# Patient Record
Sex: Female | Born: 1970 | Race: Black or African American | Hispanic: No | Marital: Married | Smoking: Never smoker
Health system: Southern US, Community
[De-identification: ages and names within clinical notes are randomized; demographics above are authoritative.]

## PROBLEM LIST (undated history)

## (undated) DIAGNOSIS — O24419 Gestational diabetes mellitus in pregnancy, unspecified control: Secondary | ICD-10-CM

## (undated) HISTORY — DX: Gestational diabetes mellitus in pregnancy, unspecified control: O24.419

---

## 1998-02-26 ENCOUNTER — Encounter: Admission: RE | Admit: 1998-02-26 | Discharge: 1998-02-26 | Payer: Self-pay | Admitting: *Deleted

## 1998-05-12 ENCOUNTER — Encounter: Admission: RE | Admit: 1998-05-12 | Discharge: 1998-05-12 | Payer: Self-pay | Admitting: Family Medicine

## 1998-06-08 ENCOUNTER — Encounter: Admission: RE | Admit: 1998-06-08 | Discharge: 1998-06-08 | Payer: Self-pay | Admitting: Family Medicine

## 1998-07-06 ENCOUNTER — Ambulatory Visit (HOSPITAL_COMMUNITY): Admission: RE | Admit: 1998-07-06 | Discharge: 1998-07-06 | Payer: Self-pay | Admitting: *Deleted

## 1998-10-22 ENCOUNTER — Encounter: Admission: RE | Admit: 1998-10-22 | Discharge: 1998-10-22 | Payer: Self-pay | Admitting: Family Medicine

## 1998-12-03 ENCOUNTER — Encounter: Admission: RE | Admit: 1998-12-03 | Discharge: 1998-12-03 | Payer: Self-pay | Admitting: Family Medicine

## 1998-12-28 ENCOUNTER — Encounter: Admission: RE | Admit: 1998-12-28 | Discharge: 1998-12-28 | Payer: Self-pay | Admitting: Family Medicine

## 1999-01-27 ENCOUNTER — Encounter: Admission: RE | Admit: 1999-01-27 | Discharge: 1999-01-27 | Payer: Self-pay | Admitting: Family Medicine

## 1999-02-25 ENCOUNTER — Ambulatory Visit (HOSPITAL_COMMUNITY): Admission: RE | Admit: 1999-02-25 | Discharge: 1999-02-25 | Payer: Self-pay | Admitting: *Deleted

## 1999-03-10 ENCOUNTER — Encounter: Admission: RE | Admit: 1999-03-10 | Discharge: 1999-03-10 | Payer: Self-pay | Admitting: Family Medicine

## 1999-03-15 ENCOUNTER — Inpatient Hospital Stay (HOSPITAL_COMMUNITY): Admission: AD | Admit: 1999-03-15 | Discharge: 1999-03-15 | Payer: Self-pay | Admitting: *Deleted

## 1999-03-28 ENCOUNTER — Encounter: Admission: RE | Admit: 1999-03-28 | Discharge: 1999-03-28 | Payer: Self-pay | Admitting: Family Medicine

## 1999-04-14 ENCOUNTER — Encounter: Admission: RE | Admit: 1999-04-14 | Discharge: 1999-04-14 | Payer: Self-pay | Admitting: Family Medicine

## 1999-04-18 ENCOUNTER — Encounter: Payer: Self-pay | Admitting: *Deleted

## 1999-04-18 ENCOUNTER — Ambulatory Visit (HOSPITAL_COMMUNITY): Admission: RE | Admit: 1999-04-18 | Discharge: 1999-04-18 | Payer: Self-pay | Admitting: *Deleted

## 1999-04-25 ENCOUNTER — Encounter: Admission: RE | Admit: 1999-04-25 | Discharge: 1999-04-25 | Payer: Self-pay | Admitting: Family Medicine

## 1999-04-29 ENCOUNTER — Encounter: Admission: RE | Admit: 1999-04-29 | Discharge: 1999-04-29 | Payer: Self-pay | Admitting: Family Medicine

## 1999-05-09 ENCOUNTER — Encounter: Admission: RE | Admit: 1999-05-09 | Discharge: 1999-05-09 | Payer: Self-pay | Admitting: Family Medicine

## 1999-05-13 ENCOUNTER — Encounter: Admission: RE | Admit: 1999-05-13 | Discharge: 1999-08-11 | Payer: Self-pay | Admitting: *Deleted

## 1999-05-19 ENCOUNTER — Encounter: Payer: Self-pay | Admitting: *Deleted

## 1999-05-19 ENCOUNTER — Ambulatory Visit (HOSPITAL_COMMUNITY): Admission: RE | Admit: 1999-05-19 | Discharge: 1999-05-19 | Payer: Self-pay | Admitting: *Deleted

## 1999-05-23 ENCOUNTER — Encounter: Admission: RE | Admit: 1999-05-23 | Discharge: 1999-05-23 | Payer: Self-pay | Admitting: Family Medicine

## 1999-05-30 ENCOUNTER — Encounter: Admission: RE | Admit: 1999-05-30 | Discharge: 1999-05-30 | Payer: Self-pay | Admitting: Family Medicine

## 1999-05-31 ENCOUNTER — Encounter (HOSPITAL_COMMUNITY): Admission: RE | Admit: 1999-05-31 | Discharge: 1999-06-24 | Payer: Self-pay | Admitting: *Deleted

## 1999-06-08 ENCOUNTER — Encounter: Admission: RE | Admit: 1999-06-08 | Discharge: 1999-06-08 | Payer: Self-pay | Admitting: Family Medicine

## 1999-06-16 ENCOUNTER — Encounter: Payer: Self-pay | Admitting: *Deleted

## 1999-06-17 ENCOUNTER — Encounter: Admission: RE | Admit: 1999-06-17 | Discharge: 1999-06-17 | Payer: Self-pay | Admitting: Family Medicine

## 1999-06-23 ENCOUNTER — Inpatient Hospital Stay (HOSPITAL_COMMUNITY): Admission: AD | Admit: 1999-06-23 | Discharge: 1999-06-26 | Payer: Self-pay | Admitting: Obstetrics & Gynecology

## 1999-06-23 ENCOUNTER — Encounter: Payer: Self-pay | Admitting: Obstetrics and Gynecology

## 1999-06-23 ENCOUNTER — Encounter (INDEPENDENT_AMBULATORY_CARE_PROVIDER_SITE_OTHER): Payer: Self-pay | Admitting: Specialist

## 1999-06-27 ENCOUNTER — Encounter: Admission: RE | Admit: 1999-06-27 | Discharge: 1999-09-20 | Payer: Self-pay | Admitting: Obstetrics and Gynecology

## 1999-06-30 ENCOUNTER — Encounter: Admission: RE | Admit: 1999-06-30 | Discharge: 1999-06-30 | Payer: Self-pay | Admitting: Family Medicine

## 1999-08-03 ENCOUNTER — Encounter: Admission: RE | Admit: 1999-08-03 | Discharge: 1999-08-03 | Payer: Self-pay | Admitting: Family Medicine

## 1999-09-22 ENCOUNTER — Encounter: Admission: RE | Admit: 1999-09-22 | Discharge: 1999-09-22 | Payer: Self-pay | Admitting: Family Medicine

## 1999-10-03 ENCOUNTER — Encounter: Admission: RE | Admit: 1999-10-03 | Discharge: 1999-10-03 | Payer: Self-pay | Admitting: Family Medicine

## 2000-08-27 ENCOUNTER — Encounter: Admission: RE | Admit: 2000-08-27 | Discharge: 2000-08-27 | Payer: Self-pay | Admitting: Family Medicine

## 2001-04-30 ENCOUNTER — Encounter: Admission: RE | Admit: 2001-04-30 | Discharge: 2001-04-30 | Payer: Self-pay | Admitting: Sports Medicine

## 2001-11-26 ENCOUNTER — Encounter: Admission: RE | Admit: 2001-11-26 | Discharge: 2001-11-26 | Payer: Self-pay | Admitting: Sports Medicine

## 2001-12-19 ENCOUNTER — Encounter: Admission: RE | Admit: 2001-12-19 | Discharge: 2001-12-19 | Payer: Self-pay | Admitting: Obstetrics and Gynecology

## 2002-01-13 ENCOUNTER — Ambulatory Visit (HOSPITAL_COMMUNITY): Admission: RE | Admit: 2002-01-13 | Discharge: 2002-01-13 | Payer: Self-pay | Admitting: Obstetrics and Gynecology

## 2002-01-31 ENCOUNTER — Inpatient Hospital Stay (HOSPITAL_COMMUNITY): Admission: AD | Admit: 2002-01-31 | Discharge: 2002-01-31 | Payer: Self-pay | Admitting: Obstetrics and Gynecology

## 2002-09-10 ENCOUNTER — Encounter: Admission: RE | Admit: 2002-09-10 | Discharge: 2002-09-10 | Payer: Self-pay | Admitting: Family Medicine

## 2002-09-17 ENCOUNTER — Encounter: Admission: RE | Admit: 2002-09-17 | Discharge: 2002-09-17 | Payer: Self-pay | Admitting: Family Medicine

## 2003-03-24 ENCOUNTER — Encounter: Admission: RE | Admit: 2003-03-24 | Discharge: 2003-03-24 | Payer: Self-pay | Admitting: Sports Medicine

## 2003-08-15 ENCOUNTER — Emergency Department (HOSPITAL_COMMUNITY): Admission: AD | Admit: 2003-08-15 | Discharge: 2003-08-15 | Payer: Self-pay | Admitting: Family Medicine

## 2003-09-08 ENCOUNTER — Encounter (INDEPENDENT_AMBULATORY_CARE_PROVIDER_SITE_OTHER): Payer: Self-pay | Admitting: Specialist

## 2003-09-08 ENCOUNTER — Encounter: Admission: RE | Admit: 2003-09-08 | Discharge: 2003-09-08 | Payer: Self-pay | Admitting: Sports Medicine

## 2003-09-24 ENCOUNTER — Encounter: Admission: RE | Admit: 2003-09-24 | Discharge: 2003-09-24 | Payer: Self-pay | Admitting: Family Medicine

## 2003-12-01 ENCOUNTER — Encounter: Admission: RE | Admit: 2003-12-01 | Discharge: 2003-12-01 | Payer: Self-pay | Admitting: Family Medicine

## 2004-01-05 ENCOUNTER — Encounter: Admission: RE | Admit: 2004-01-05 | Discharge: 2004-01-05 | Payer: Self-pay | Admitting: Family Medicine

## 2004-09-09 ENCOUNTER — Encounter (INDEPENDENT_AMBULATORY_CARE_PROVIDER_SITE_OTHER): Payer: Self-pay | Admitting: *Deleted

## 2004-09-09 ENCOUNTER — Other Ambulatory Visit: Admission: RE | Admit: 2004-09-09 | Discharge: 2004-09-09 | Payer: Self-pay | Admitting: Family Medicine

## 2004-09-09 ENCOUNTER — Ambulatory Visit: Payer: Self-pay | Admitting: Family Medicine

## 2004-11-01 ENCOUNTER — Ambulatory Visit: Payer: Self-pay | Admitting: Family Medicine

## 2005-04-13 ENCOUNTER — Ambulatory Visit: Payer: Self-pay | Admitting: Family Medicine

## 2005-06-20 ENCOUNTER — Ambulatory Visit: Payer: Self-pay | Admitting: Sports Medicine

## 2005-09-18 ENCOUNTER — Ambulatory Visit: Payer: Self-pay | Admitting: Sports Medicine

## 2005-09-18 ENCOUNTER — Encounter (INDEPENDENT_AMBULATORY_CARE_PROVIDER_SITE_OTHER): Payer: Self-pay | Admitting: *Deleted

## 2005-09-20 ENCOUNTER — Encounter (INDEPENDENT_AMBULATORY_CARE_PROVIDER_SITE_OTHER): Payer: Self-pay | Admitting: *Deleted

## 2005-09-20 LAB — CONVERTED CEMR LAB

## 2005-10-30 ENCOUNTER — Ambulatory Visit (HOSPITAL_COMMUNITY): Admission: RE | Admit: 2005-10-30 | Discharge: 2005-10-30 | Payer: Self-pay | Admitting: Family Medicine

## 2005-10-30 ENCOUNTER — Ambulatory Visit: Payer: Self-pay | Admitting: Family Medicine

## 2006-01-16 ENCOUNTER — Ambulatory Visit (HOSPITAL_COMMUNITY): Admission: RE | Admit: 2006-01-16 | Discharge: 2006-01-16 | Payer: Self-pay | Admitting: Obstetrics & Gynecology

## 2006-02-20 ENCOUNTER — Ambulatory Visit (HOSPITAL_COMMUNITY): Admission: RE | Admit: 2006-02-20 | Discharge: 2006-02-20 | Payer: Self-pay | Admitting: Obstetrics & Gynecology

## 2006-03-11 ENCOUNTER — Inpatient Hospital Stay (HOSPITAL_COMMUNITY): Admission: AD | Admit: 2006-03-11 | Discharge: 2006-03-12 | Payer: Self-pay | Admitting: Obstetrics & Gynecology

## 2006-04-20 ENCOUNTER — Ambulatory Visit (HOSPITAL_COMMUNITY): Admission: RE | Admit: 2006-04-20 | Discharge: 2006-04-20 | Payer: Self-pay | Admitting: Obstetrics & Gynecology

## 2006-06-02 IMAGING — US US OB COMP LESS 14 WK
1 series · 18 of 28 positions shown · non-contrast
Comparison: none

CLINICAL DATA: Evaluate location of pregnancy.  Status post bilateral tubal ligation in 2110 with a positive pregnancy test.
 OBSTETRICAL ULTRASOUND <14 WKS AND TRANSVAGINAL OB US:
TECHNIQUE: Both transabdominal and transvaginal ultrasound examinations were performed for complete evaluation of the gestation as well as the maternal uterus, adnexal regions, and pelvic cul-de-sac.

[Series 1: us ob comp less 14 wk · 18 of 41 slices shown]
[im 1/41]
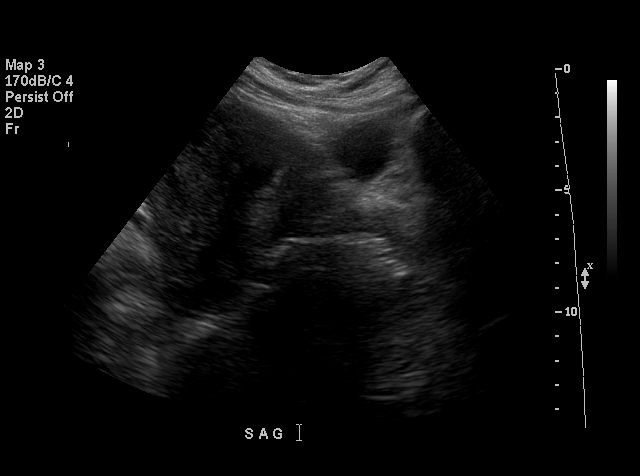
[im 3/41]
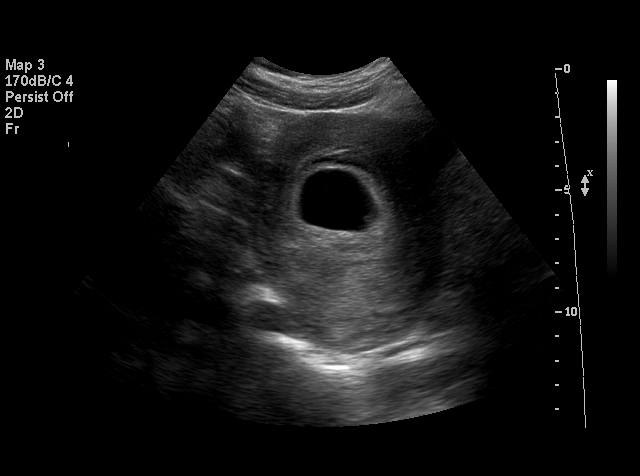
[im 5/41]
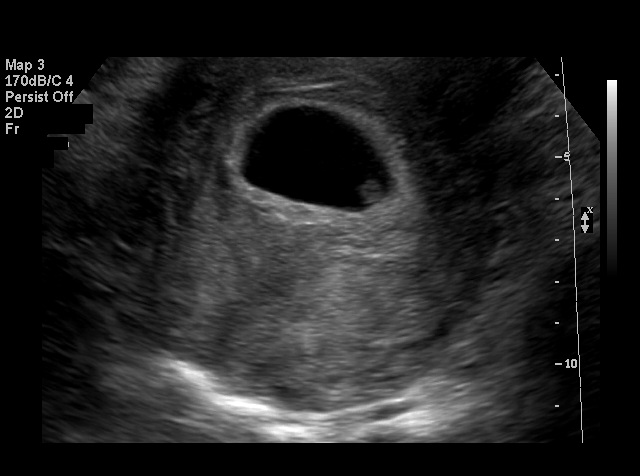
[im 8/41]
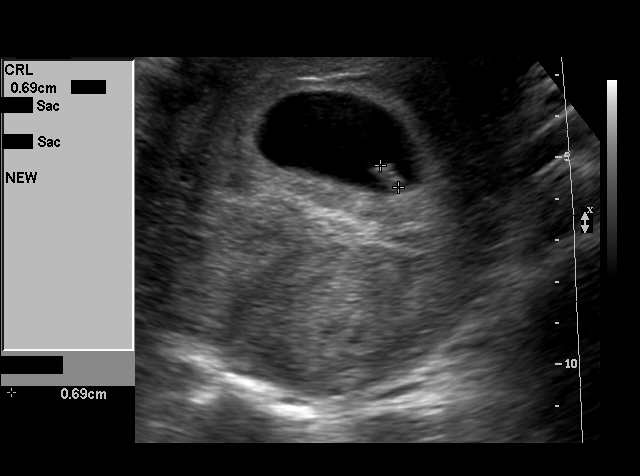
[im 11/41]
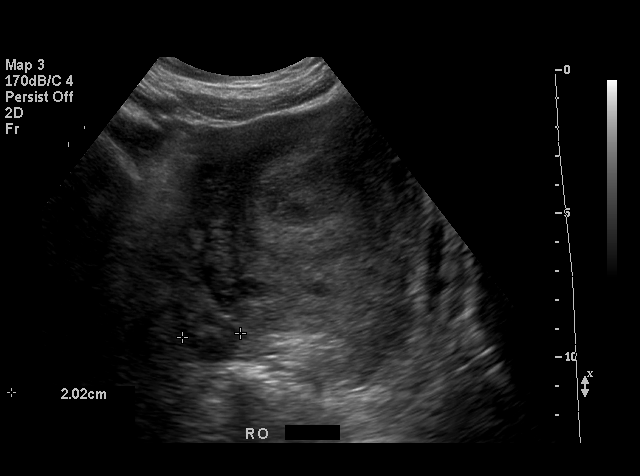
[im 12/41]
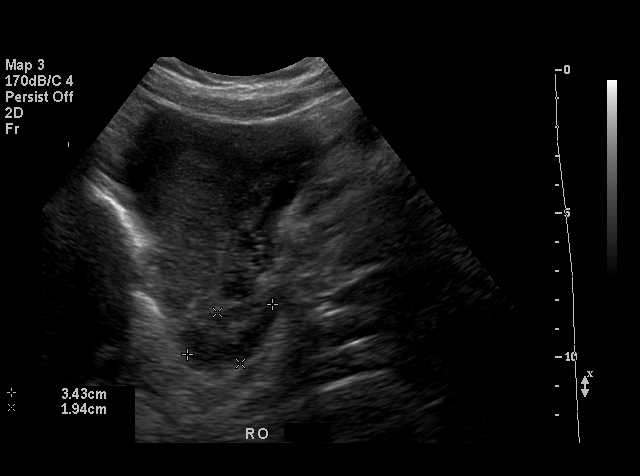
[im 15/41]
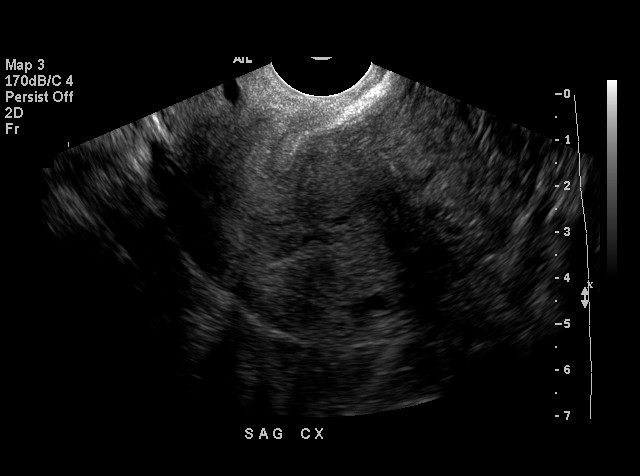
[im 17/41]
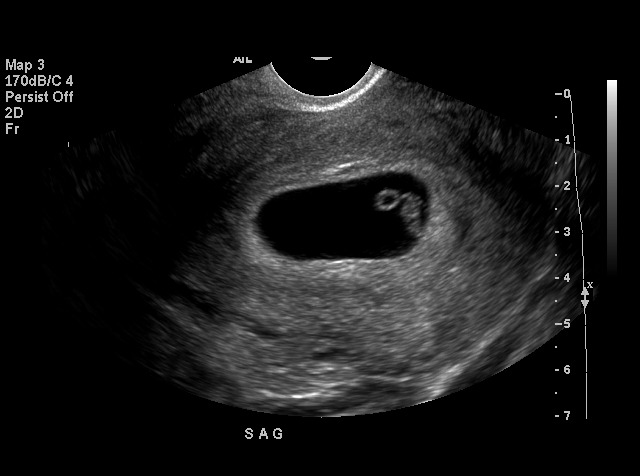
[im 20/41]
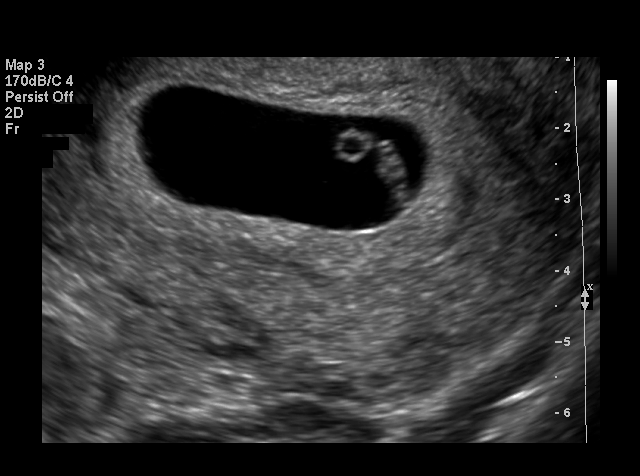
[im 21/41]
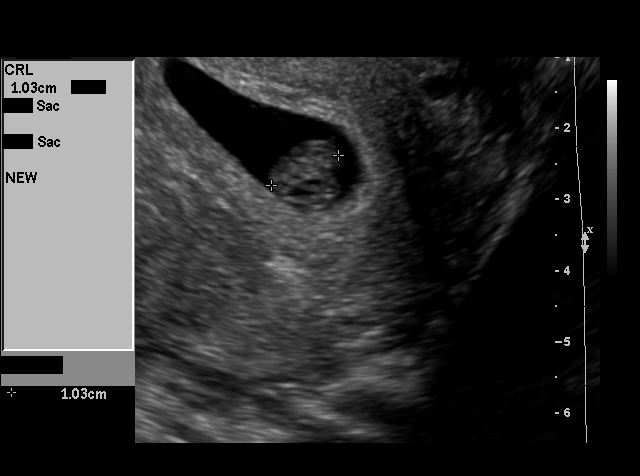
[im 24/41]
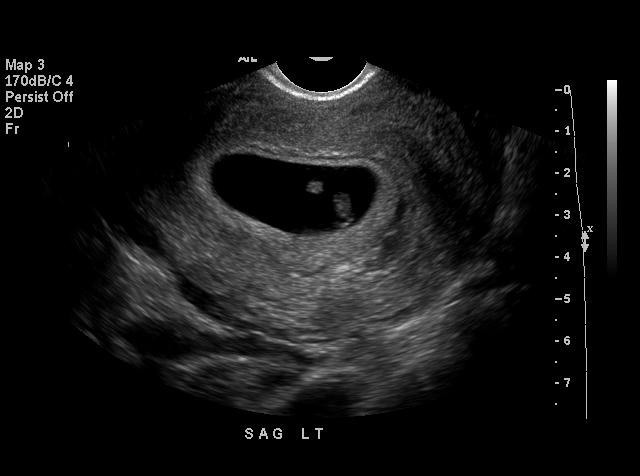
[im 26/41]
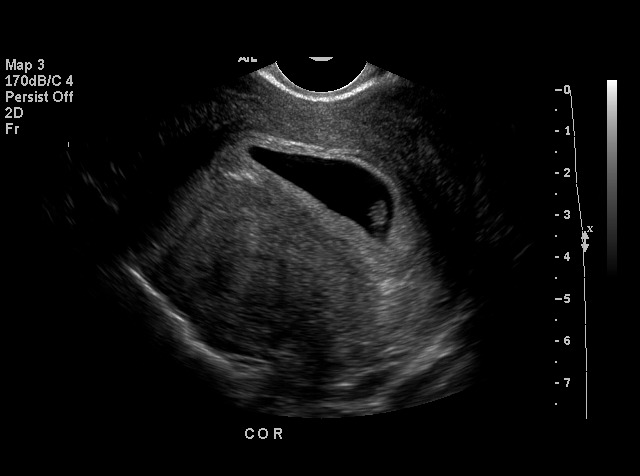
[im 29/41]
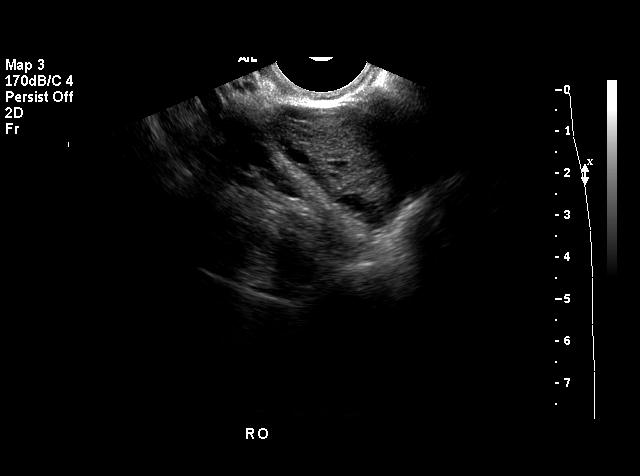
[im 32/41]
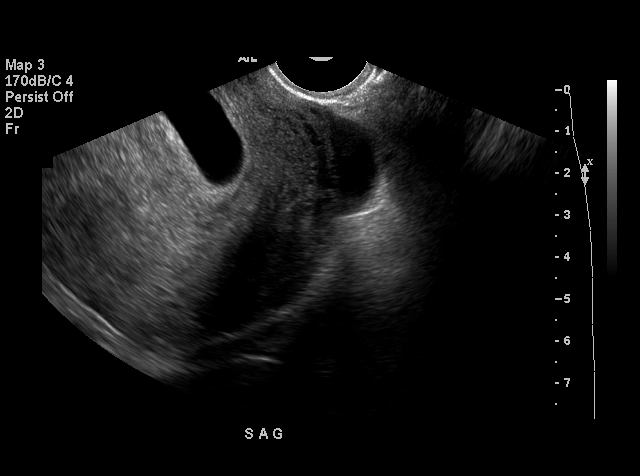
[im 33/41]
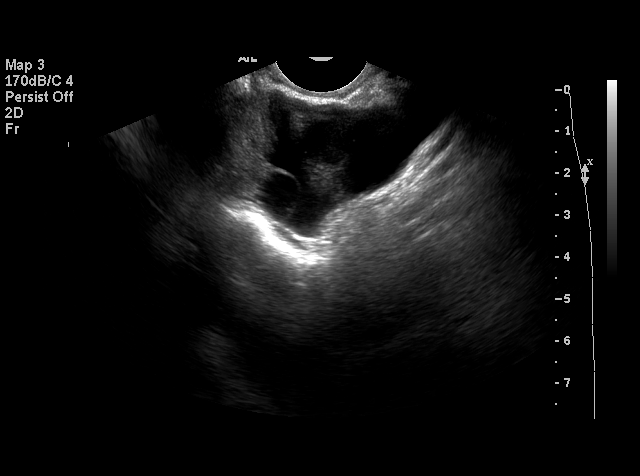
[im 36/41]
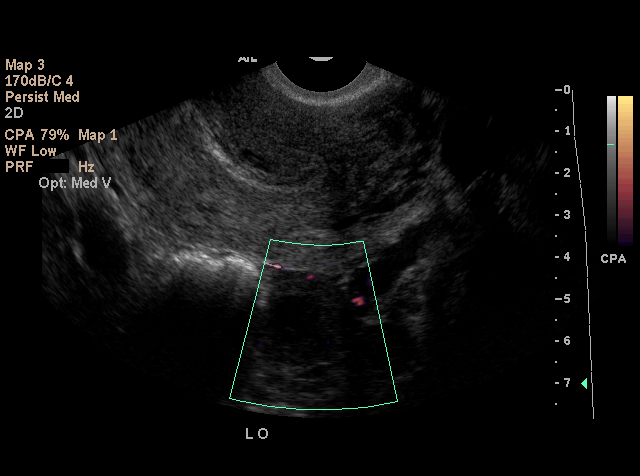
[im 38/41]
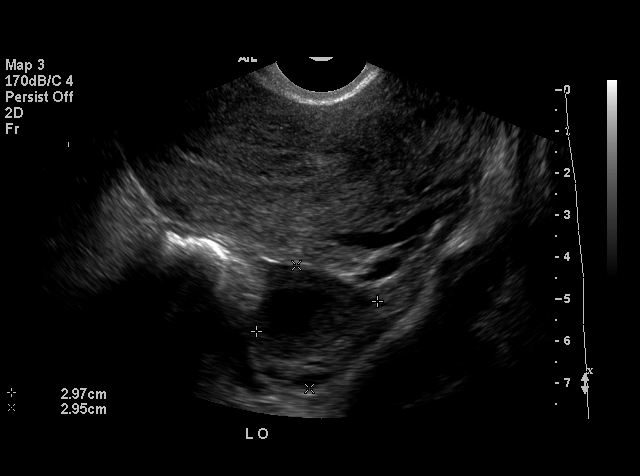
[im 41/41]
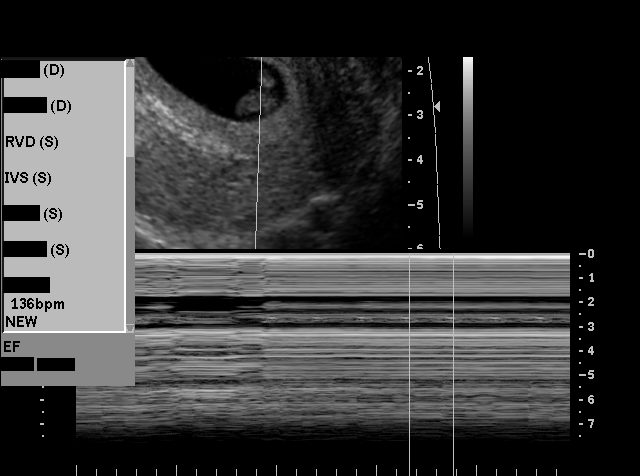

[18 of 28 positions shown; findings below may reference images not displayed]

FINDINGS: Multiple images of the uterus and adnexa were obtained using a transabdominal and endovaginal approaches.
 There is a single intrauterine pregnancy identified that demonstrates an estimated gestational age by crown rump length of 7 weeks and 1 days.  Positive regular fetal cardiac activity with a rate of 140 bpm was noted.  A normal appearing yolk sac is seen.  No signs of subchorionic hemorrhage are evident.
 Both ovaries are visualized with the right ovary measuring 3.4 x 1.9 x 2.0 cm and the left ovary measuring 2.2 x 3.7 x 2.1 cm containing a corpus luteum cyst.
 No cul-de-sac or periovarian fluid is seen and no separate adnexal masses are noted.
IMPRESSION: 7 week 1 day living intrauterine pregnancy.  Normal ovaries.

## 2006-06-15 ENCOUNTER — Ambulatory Visit (HOSPITAL_COMMUNITY): Admission: RE | Admit: 2006-06-15 | Discharge: 2006-06-15 | Payer: Self-pay | Admitting: Obstetrics & Gynecology

## 2006-06-18 ENCOUNTER — Inpatient Hospital Stay (HOSPITAL_COMMUNITY): Admission: AD | Admit: 2006-06-18 | Discharge: 2006-06-18 | Payer: Self-pay | Admitting: Obstetrics

## 2006-06-19 ENCOUNTER — Inpatient Hospital Stay (HOSPITAL_COMMUNITY): Admission: AD | Admit: 2006-06-19 | Discharge: 2006-06-22 | Payer: Self-pay | Admitting: Obstetrics & Gynecology

## 2006-09-13 DIAGNOSIS — N879 Dysplasia of cervix uteri, unspecified: Secondary | ICD-10-CM | POA: Insufficient documentation

## 2006-09-14 ENCOUNTER — Encounter (INDEPENDENT_AMBULATORY_CARE_PROVIDER_SITE_OTHER): Payer: Self-pay | Admitting: *Deleted

## 2006-09-23 IMAGING — US US OB FOLLOW-UP
1 series · 13 of 28 positions shown · non-contrast
Comparison: none

CLINICAL DATA: Assigned gestational age is 24 weeks 1 day; history of prior pregnancy with IUGR.

[Series 1: us ob follow-up · 0.33mm/px · 13 of 49 slices shown]
[im 2/49]
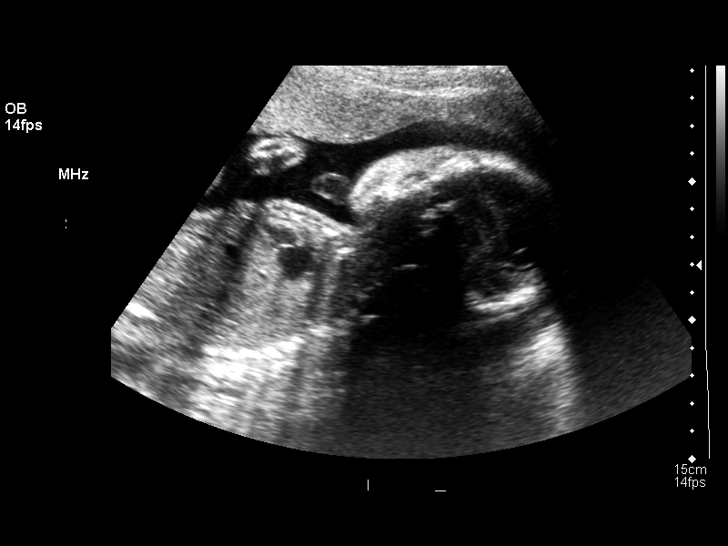
[im 6/49]
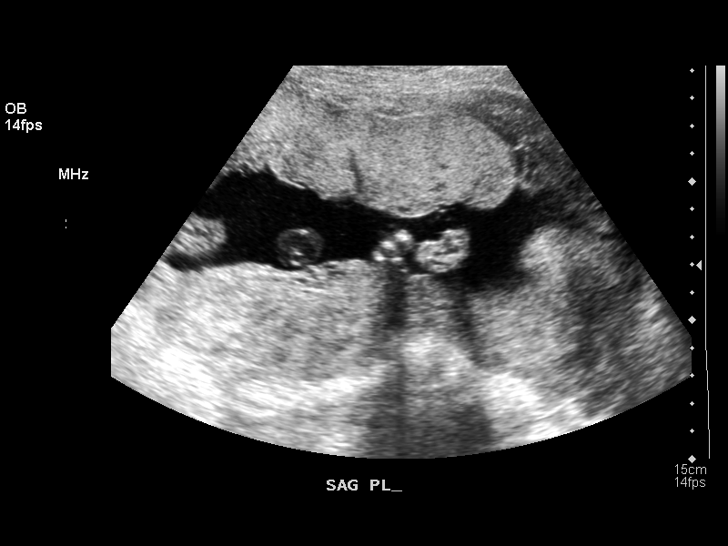
[im 9/49]
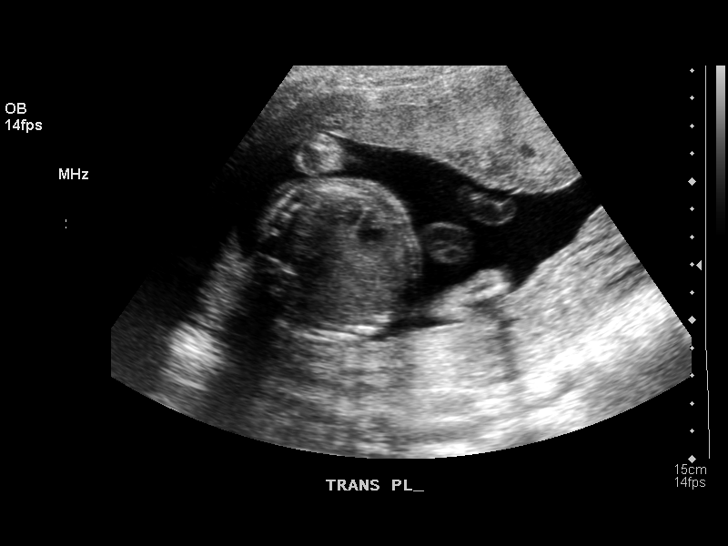
[im 13/49]
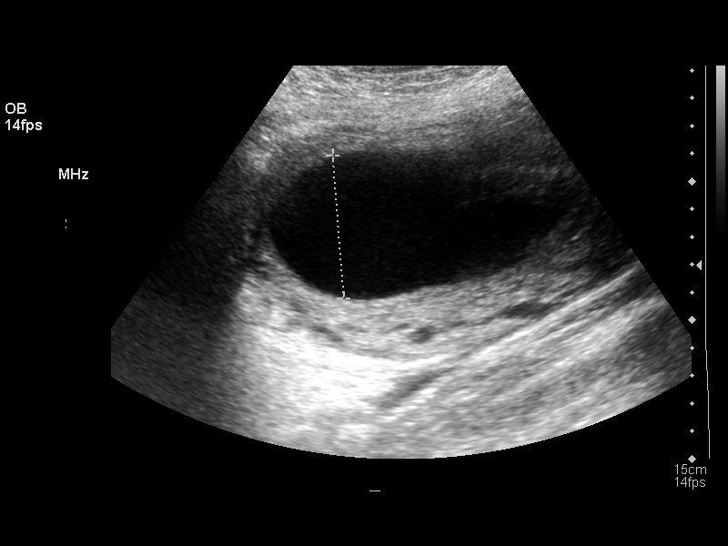
[im 17/49]
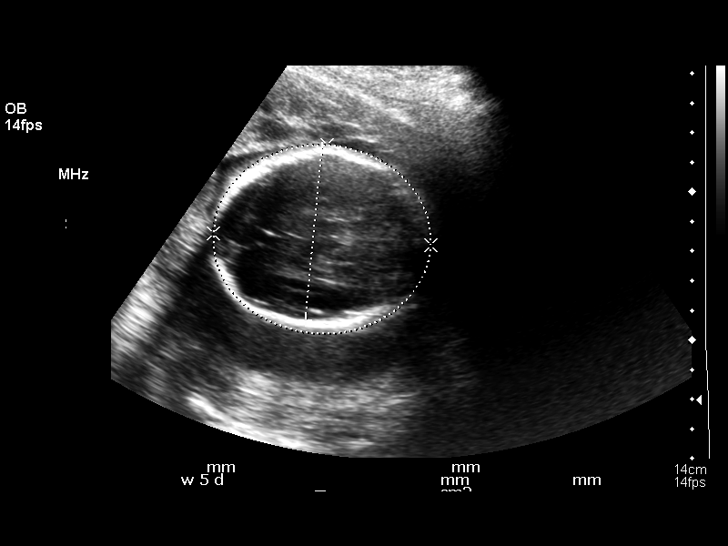
[im 20/49]
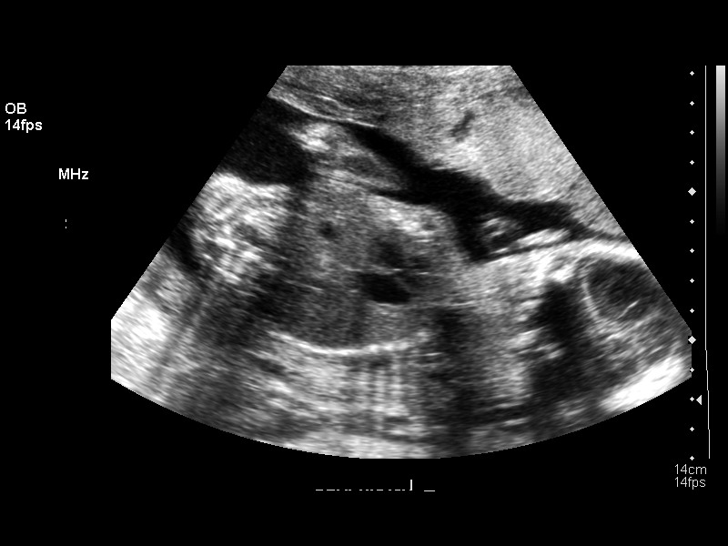
[im 25/49]
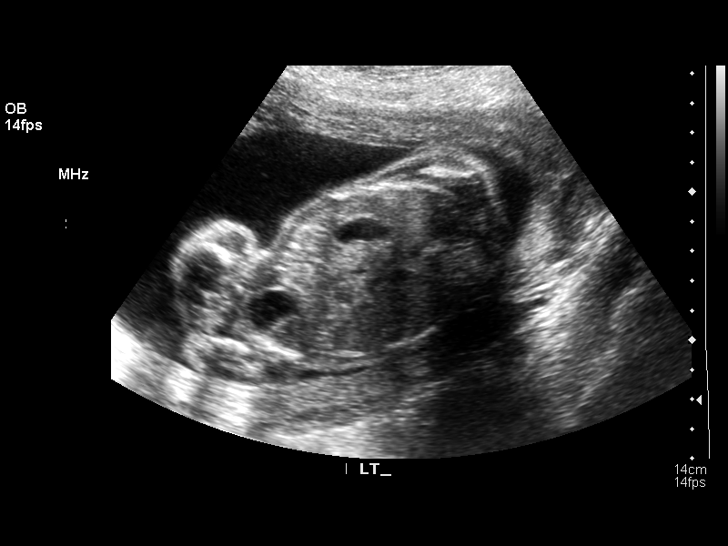
[im 29/49]
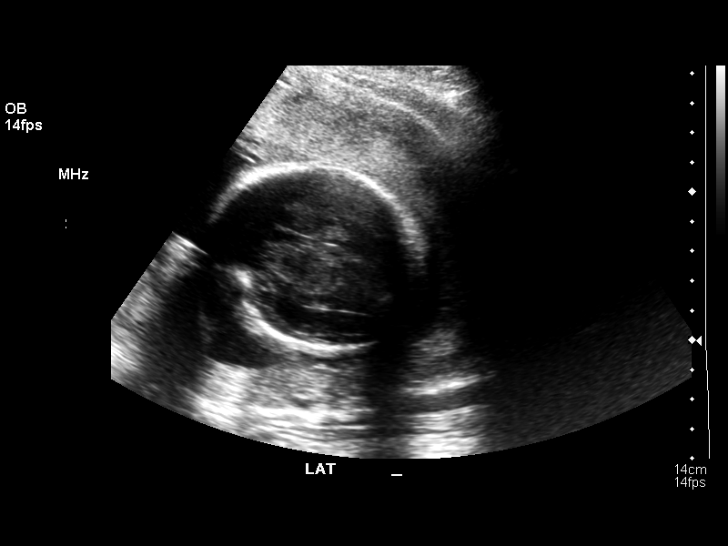
[im 33/49]
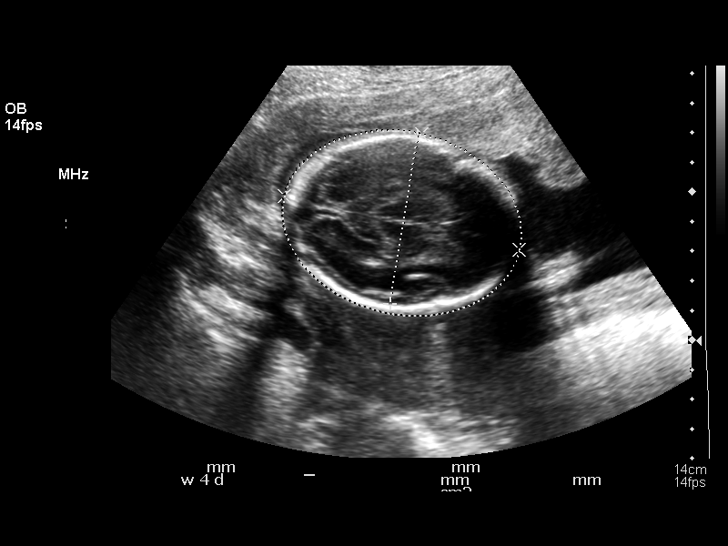
[im 36/49]
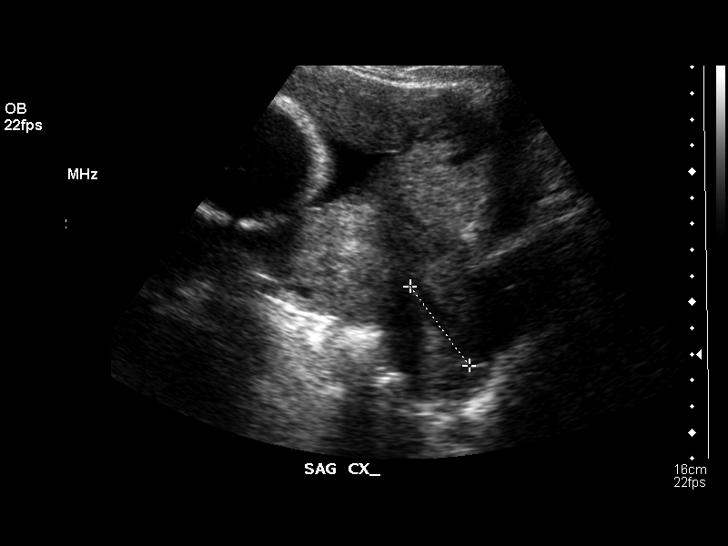
[im 40/49]
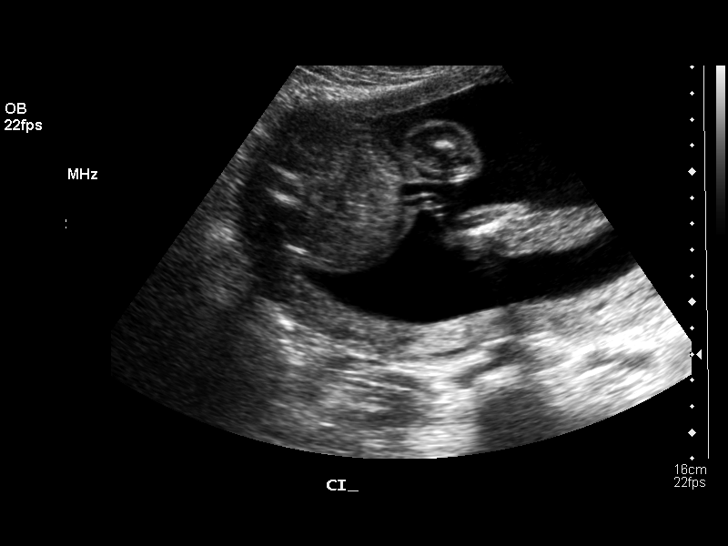
[im 43/49]
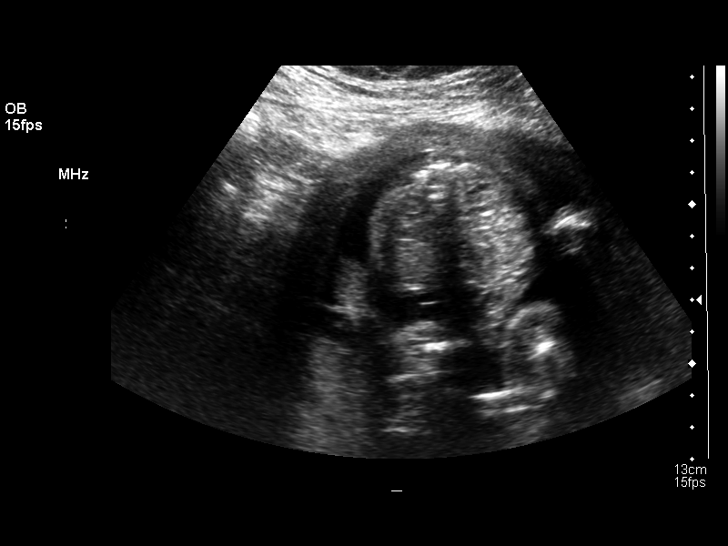
[im 47/49]
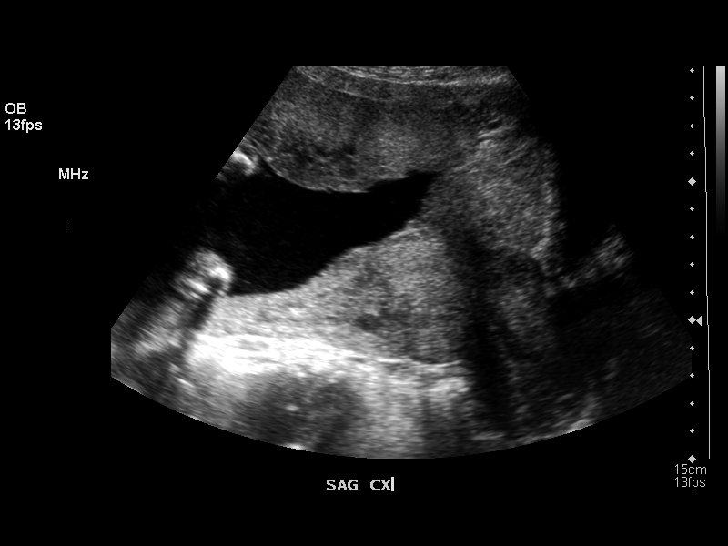

[13 of 28 positions shown; findings below may reference images not displayed]

OBSTETRICAL ULTRASOUND RE-EVALUATION:
 Number of Fetuses:  1
 Heart Rate:  150 bpm
 Movement:  yes
 Breathing:  no
 Presentation:  cephalic
 Placental Location:  anterior
 Grade:  I
 Previa:  no
 Amniotic Fluid (subjective):  normal
 Amniotic Fluid (objective):  5.8 cm  vertical pocket 

 FETAL BIOMETRY
 BPD:    5.7 cm  23 w 4 d
 HC:  22.0 cm  24 w 1 d
 AC:  19.2 cm  24 w 0 d
 FL:  4.3 cm  24 w 2 d 

 Mean GA:  24 w 0 d  US EDC:  06/12/06
 Assigned GA:  24 w 1 d  Assigned EDC:  06/11/06

 FETAL ANATOMY
 Lateral Ventricles:    Visualized 
 Thalami/CSP:  Previously visualized 
 Posterior Fossa:  Visualized 
 Nuchal Region:  Previously visualized 
 Spine:  Previously visualized 
 4 Chamber Heart on Left:  Previously visualized   
 Stomach on Left:  Visualized 
 3 Vessel Cord:  Previously visualized 
 Cord Insertion Site:  Previously visualized 
 Kidneys:  Visualized 
 Bladder:  Visualized 
 Extremities:  Previously visualized 

 MATERNAL UTERINE AND ADNEXAL FINDINGS
 Cervix:  Closed.
IMPRESSION: There is a single living intrauterine gestation in cephalic presentation.  The mean gestational age by today?s ultrasound is 24 weeks 0 days which is concordant with the assigned gestational age.  The fetal indices are concordant and the visualized anatomy is normal.

## 2006-10-01 ENCOUNTER — Telehealth (INDEPENDENT_AMBULATORY_CARE_PROVIDER_SITE_OTHER): Payer: Self-pay | Admitting: *Deleted

## 2006-10-02 ENCOUNTER — Ambulatory Visit: Payer: Self-pay | Admitting: Family Medicine

## 2006-10-02 LAB — CONVERTED CEMR LAB: Rapid Strep: POSITIVE

## 2006-11-21 IMAGING — US US OB FOLLOW-UP
1 series · 13 of 27 positions shown · non-contrast
Comparison: none

CLINICAL DATA: 32 week 4 day assigned gestational age.  Preterm labor.  Evaluate fetal growth.

[Series 1: us ob follow-up · 0.39mm/px · 13 of 27 slices shown]
[im 2/27]
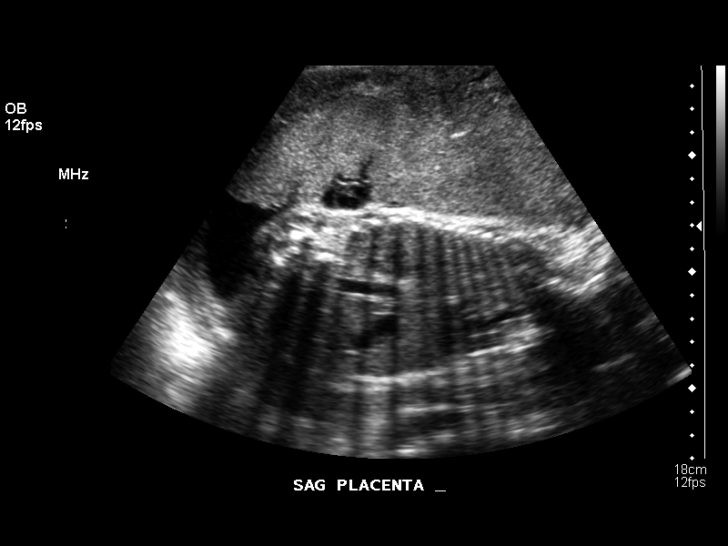
[im 4/27]
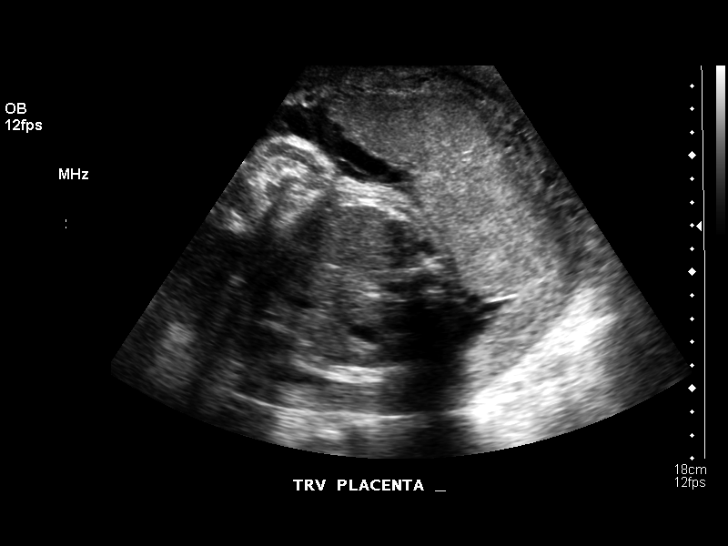
[im 6/27]
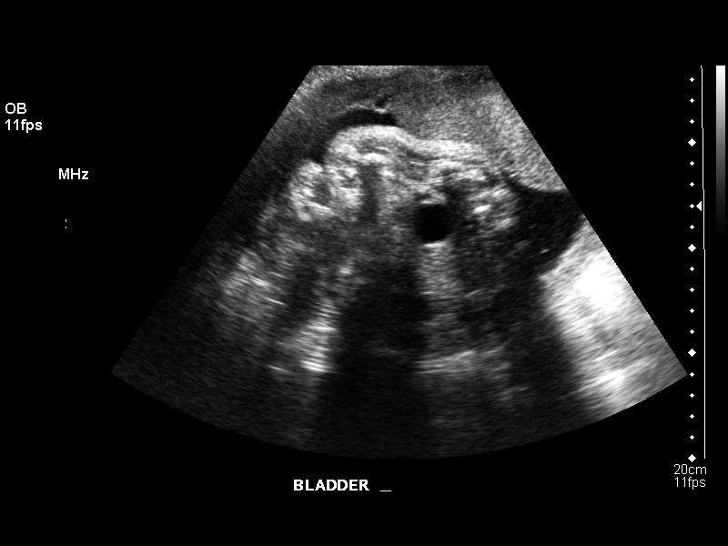
[im 8/27]
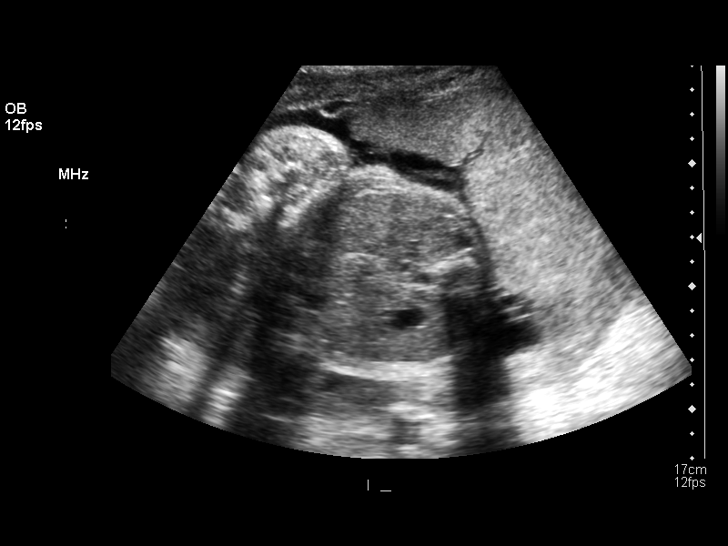
[im 10/27]
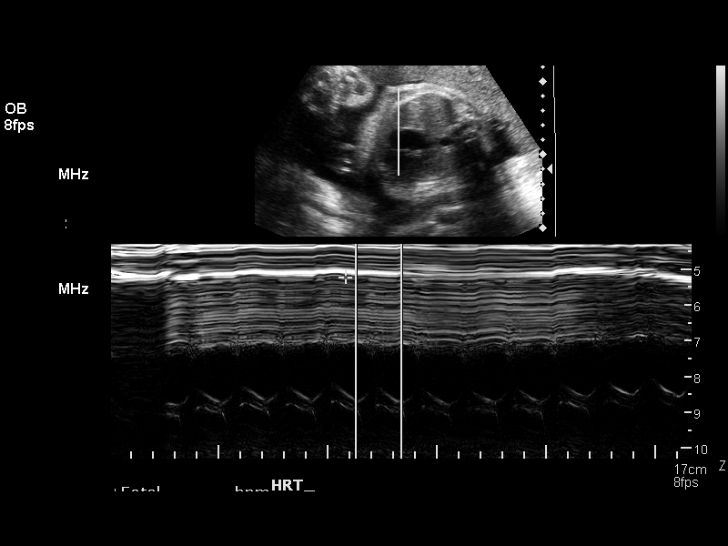
[im 12/27]
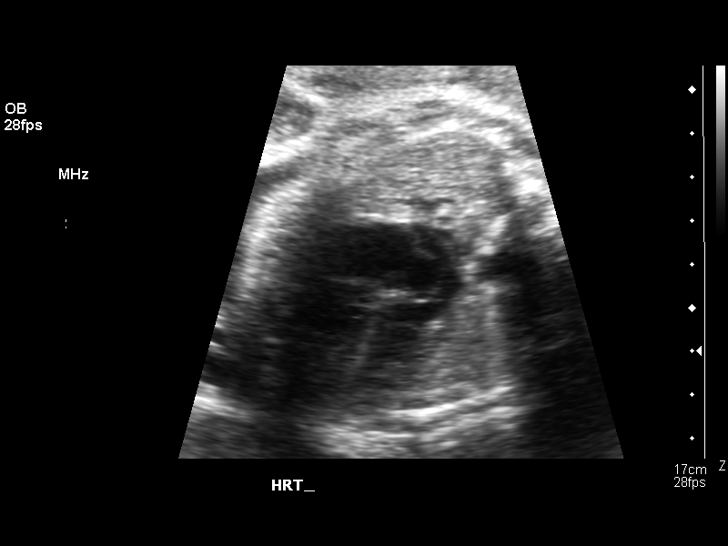
[im 14/27]
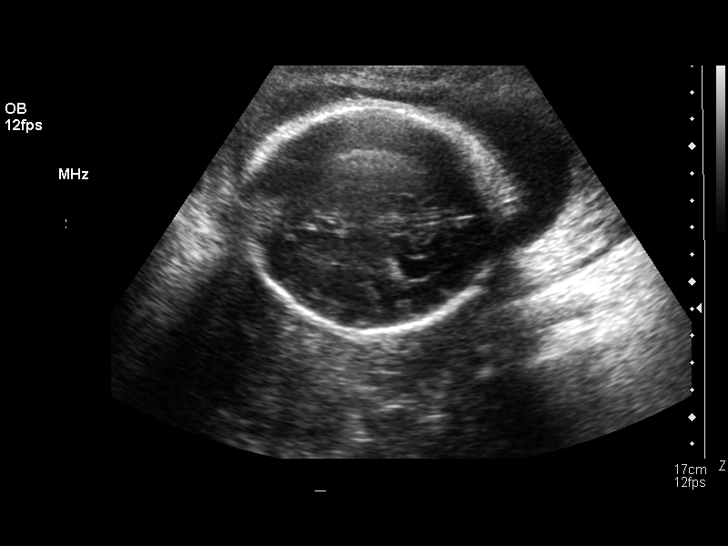
[im 16/27]
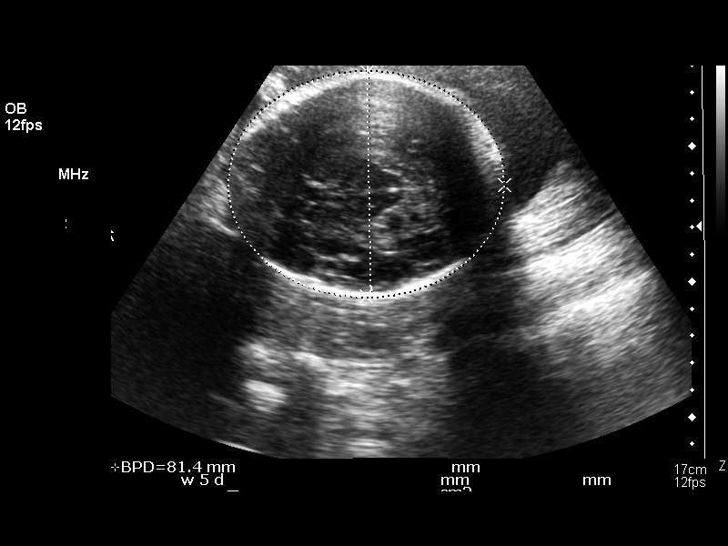
[im 18/27]
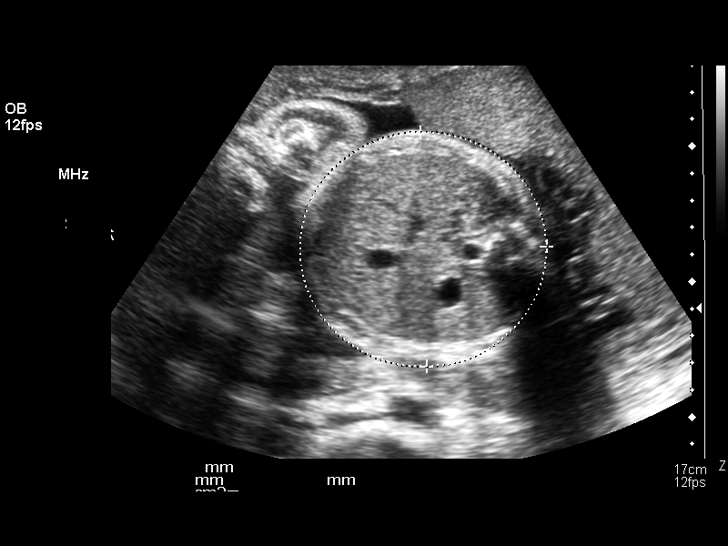
[im 20/27]
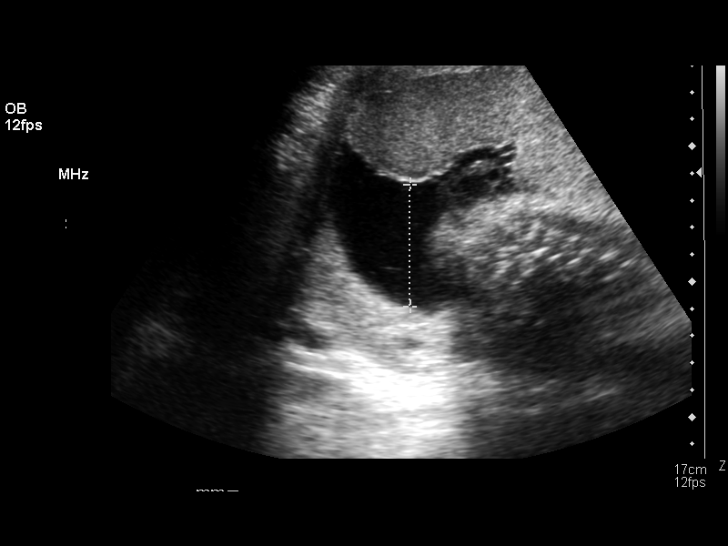
[im 22/27]
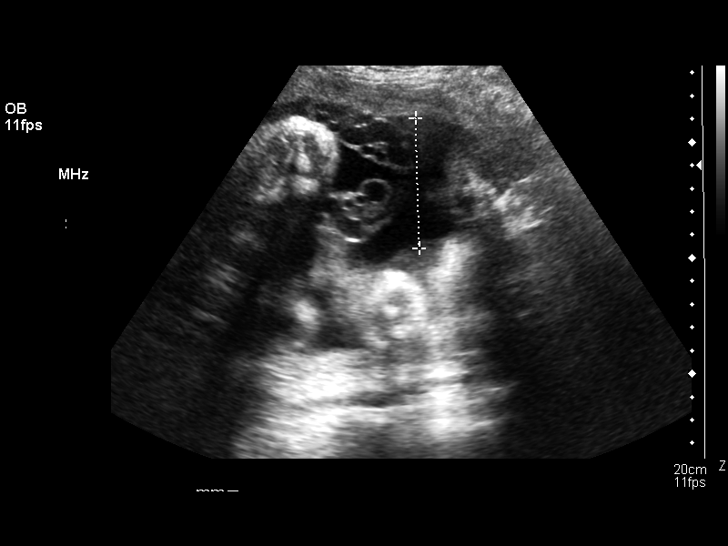
[im 24/27]
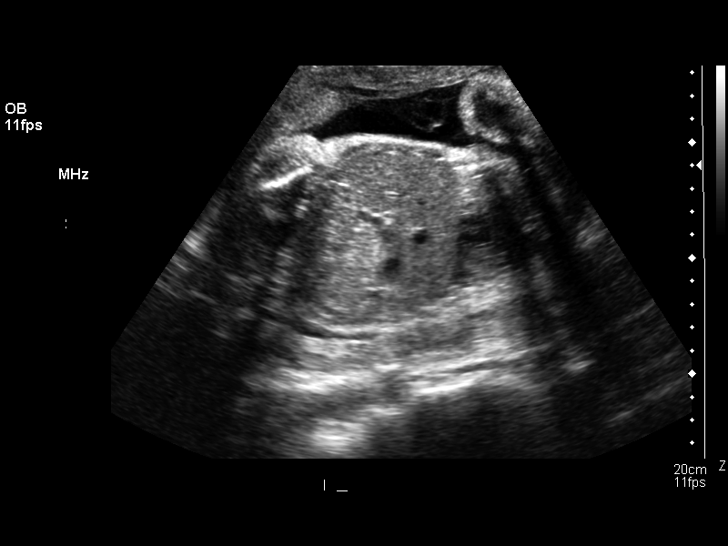
[im 26/27]
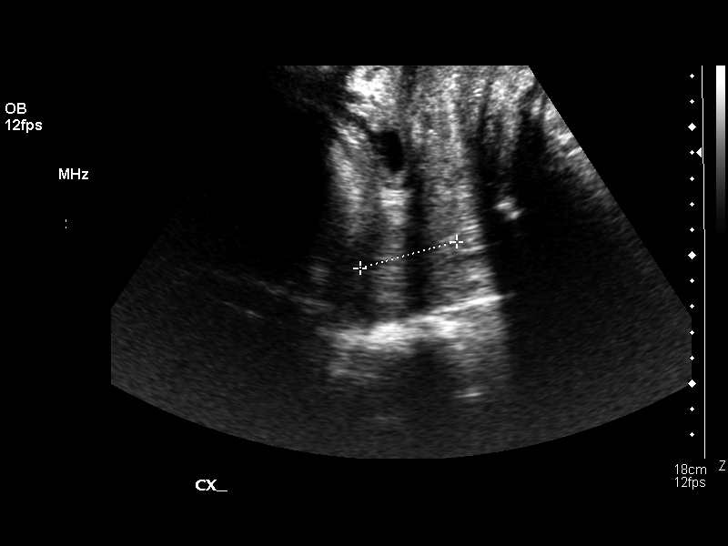

[13 of 27 positions shown; findings below may reference images not displayed]

OBSTETRICAL ULTRASOUND RE-EVALUATION:
 Number of Fetuses:  1
 Heart Rate:  144 bpm
 Movement:  Yes
 Breathing:  Yes
 Presentation:  Cephalic
 Placental Location:  Anterior
 Grade:  I
 Previa:  No
 Amniotic Fluid (subjective):  Normal
 Amniotic Fluid (objective):  AFI 13.9 cm (8th-58th %ile = 8.3 to 24.5 cm for 33 weeks) 

 FETAL BIOMETRY
 BPD:  8.1 cm  32 w 5 d
 HC:  29.3 cm  32 w 2 d
 AC:  27.8 cm  31 w 6 d
 FL:  6.3 cm   32 w 3 d 

 Mean GA:  32 w 2 d   US EDC:  06/13/06
 Assigned GA:  32 w 4 d   Assigned EDC:  06/11/06

 EFW:  4867 grams 25th ? 50th %ile (0040 ? 5454 g) for 33 weeks

 FETAL ANATOMY
 Lateral Ventricles:    Visualized 
 Thalami/CSP:    Previously visualized 
 Posterior Fossa:    Previously visualized 
 Nuchal Region:    Previously visualized 
 Spine:    Previously visualized 
 4 Chamber Heart on Left:  Visualized 
 Stomach on Left:  Visualized 
 3 Vessel Cord:  Previously visualized 
 Cord Insertion Site:  Previously visualized 
 Kidneys:  Visualized 
 Bladder:  Visualized 
 Extremities:  Previously visualized 

 ADDITIONAL ANATOMY VISUALIZED:  Diaphragm.

 MATERNAL UTERINE AND ADNEXAL FINDINGS
 Cervix:  3.9 cm translabial.
IMPRESSION: 1.  Assigned gestational age is currently 32 weeks 4 days.  Appropriate fetal growth with EFW at approximately 50th percentile.  
 2.  Normal amniotic fluid volume.  Normal cervical length.

## 2007-05-07 ENCOUNTER — Telehealth: Payer: Self-pay | Admitting: *Deleted

## 2007-10-03 ENCOUNTER — Ambulatory Visit (HOSPITAL_COMMUNITY): Admission: RE | Admit: 2007-10-03 | Discharge: 2007-10-03 | Payer: Self-pay | Admitting: Obstetrics & Gynecology

## 2008-02-13 ENCOUNTER — Ambulatory Visit (HOSPITAL_COMMUNITY): Admission: RE | Admit: 2008-02-13 | Discharge: 2008-02-14 | Payer: Self-pay | Admitting: Obstetrics & Gynecology

## 2008-02-13 ENCOUNTER — Encounter: Payer: Self-pay | Admitting: Obstetrics & Gynecology

## 2008-02-13 HISTORY — PX: LAPAROSCOPIC TOTAL HYSTERECTOMY: SUR800

## 2008-03-30 ENCOUNTER — Telehealth: Payer: Self-pay | Admitting: *Deleted

## 2008-03-31 ENCOUNTER — Ambulatory Visit: Payer: Self-pay | Admitting: Family Medicine

## 2008-03-31 DIAGNOSIS — J029 Acute pharyngitis, unspecified: Secondary | ICD-10-CM

## 2008-03-31 DIAGNOSIS — J069 Acute upper respiratory infection, unspecified: Secondary | ICD-10-CM | POA: Insufficient documentation

## 2008-03-31 LAB — CONVERTED CEMR LAB: Rapid Strep: NEGATIVE

## 2010-03-18 ENCOUNTER — Ambulatory Visit: Payer: Self-pay | Admitting: Family Medicine

## 2010-03-18 DIAGNOSIS — M1710 Unilateral primary osteoarthritis, unspecified knee: Secondary | ICD-10-CM | POA: Insufficient documentation

## 2010-03-30 ENCOUNTER — Encounter: Payer: Self-pay | Admitting: Family Medicine

## 2010-03-30 ENCOUNTER — Ambulatory Visit: Payer: Self-pay | Admitting: Family Medicine

## 2010-04-05 LAB — CONVERTED CEMR LAB
Cholesterol: 202 mg/dL — ABNORMAL HIGH (ref 0–200)
HDL: 51 mg/dL (ref >39)
Total CHOL/HDL Ratio: 4
Triglycerides: 57 mg/dL (ref <150)
VLDL: 11 mg/dL (ref 0–40)

## 2010-08-07 ENCOUNTER — Encounter: Payer: Self-pay | Admitting: Family Medicine

## 2010-08-16 NOTE — Assessment & Plan Note (Signed)
Summary: cpe,tcb   Vital Signs:  Patient profile:   40 year old female Height:      69 inches Weight:      175 pounds BMI:     25.94 BSA:     1.95 Temp:     98.4 degrees F oral Pulse rate:   80 / minute BP sitting:   107 / 68  (right arm)  Vitals Entered By: Tomasita Morrow RN (March 18, 2010 3:26 PM) Pain Assessment Patient in pain? no      Nutritional Status BMI of 25 - 29 = overweight  Have you ever been in a relationship where you felt threatened, hurt or afraid?No   Primary Provider:  Dessa Phi MD   History of Present Illness: Pt is here for a well adult visit. She is taking no medications. 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 She now complains of L sided chest pain, sharp, non-radiating, 10 sec in duration, 1 episode/week. She denies dizziness, HA, changes in vision. The pain is not related to activity, food or position.   Pt last pap smear was in 2009 prior to her hysterectomy for menorrhaggia.   Pt reports abdominal tenderness present for the last 15 yrs, prior to prev C-sections and hysterectomy. The tenderness is reporducible on deep palpation. She denies N/V/D and constipation.   Pt also reports L knee pain on exertion. She works a a Manufacturing systems engineer and states that the pain is worse when changing positions from kneeling to standing.   Allergies: No Known Drug Allergies  Past History:  Past Medical History: Last updated: 09/13/2006 ? Marfans, gestational diabetes 05/09/99  Family History: Last updated: 09/13/2006 3 sisters-healthy, dad-htn, mgm-breast cancer, mom-htn  Past Surgical History: 2d Echo 12/99 normal, no aortic root dilitation -, LTCS - 12/00 - 10/04/1999  Hysterectomy 2009.   Review of Systems       As per HPI.  Physical Exam  General:  Well-developed,well-nourished,in no acute distress; alert,appropriate and cooperative throughout examination Neck:  No deformities, masses, or tenderness noted. Lungs:  Normal  respiratory effort, chest expands symmetrically. Lungs are clear to auscultation, no crackles or wheezes. Heart:  Normal rate and regular rhythm. S1 and S2 normal without gallop, murmur, click, rub or other extra sounds. Abdomen:  Bowel sounds positive,abdomen soft and without masses, organomegaly or hernias noted.  B/L periumbilical tenderness on deep palpation.  Msk:  normal ROM, no joint tenderness, no joint warmth, and no joint instability.   b/l knee crepitation.     Impression & Recommendations:  Problem # 1:  SCREENING FOR LIPOID DISORDERS (ICD-V77.91)  Orders: [facility]- Est Level  3 (99213)Future Orders: Lipid-[facility] ([id number]) ... 03/18/2011  Complete Medication List: 1)  Tessalon Perles 100 Mg Caps (Benzonatate) .Marland Kitchen.. 1 tablet by mouth three times a day as needed for cough  Appended Document: cpe,tcb          New Problems PHYSICAL EXAMINATION (ICD-V70.0)   Allergies NKA

## 2010-08-28 ENCOUNTER — Encounter: Payer: Self-pay | Admitting: *Deleted

## 2010-11-29 NOTE — Op Note (Signed)
NAMESANVI, Ashlee Hardy             ACCOUNT NO.:  [id number]   MEDICAL RECORD NO.:  [id number]          PATIENT TYPE:  OIB   LOCATION:  9320                          FACILITY:  [facility]   PHYSICIAN:  Roseanna Rainbow, M.D.DATE OF BIRTH:  06/02/1971   DATE OF PROCEDURE:  02/13/2008  DATE OF DISCHARGE:                               OPERATIVE REPORT   PREOPERATIVE DIAGNOSES:  Menorrhagia, pelvic pain.   POSTOPERATIVE DIAGNOSES:  Menorrhagia, pelvic pain.   PROCEDURE:  Total laparoscopic hysterectomy.   SURGEON:  Roseanna Rainbow, MD   ANESTHESIA:  General endotracheal.   ESTIMATED BLOOD LOSS:  Less than 100 mL.   COMPLICATIONS:  Prolonged time under anesthesia.   IV FLUIDS AND URINE OUTPUT:  As per Anesthesiology.   FINDINGS:  The uterus sounded to approximately 11 cm.  Upon survey of  the pelvic viscera, the anatomy appeared normal.   PROCEDURE:  The patient was taken to the operating room with an IV  running.  She was given general anesthesia and prepped and draped in the  usual sterile fashion.  A bivalve speculum had been placed in the  patient's vagina.  The anterior lip of the cervix was grasped with a  single-tooth tenaculum.  The cervix was then dilated with [facility]  dilators.  The uterus sounded to 11 cm.  The ZUMI manipulator was then  advanced into the uterine fundus.  A medium-sized coring was fit snugly  against the uterine cervix, and the speculum was removed.  A pneumo-  occluder was then placed over the ZUMI.  The ZUMI balloon was filled  with 7 mL of air.  Please note, Foley catheter had been placed prior to  placement of the uterine manipulator.  An infraumbilical skin incision  was then made through the previous scar.  This was carried down sharply  to the fascia.  The fascia was tented up and entered.  The parietal  peritoneum was entered and the incision was free of adhesions.  Bilateral lower quadrant 5-mm ports were then placed under direct  visualization.  A 10-mm suprapubic port was then placed under direct  visualization after the abdomen had been insufflated with CO2 gas.  The  ovaries were to be conserved at this point, a window was made in the  broad ligament below the round ligament, in the triangle formed by the  round ligament and IP ligament and external vessels.  The fallopian  tubes and utero-ovarian ligament pedicles were then desiccated using the  gyrus and divided.  The round ligament was then divided using monopolar  shears with coagulating current.  The broad ligament was then incised to  the midline anteriorly.  The uterovesical peritoneum was incised and  dissected off the bladder to expose the anterior vagina.  The uterine  vessels were then skeletonized.  The uterine vessels were then coapted  with the gyrus and was transected.  This was repeated on the patient's  left.  An anterior colpotomy incision was made over the rim of the Koh  ring.  However, the pneumo-occluder had not been inflated and there was  a loss  of pneumoperitoneum, at which point the pneumo-occluder had to be  replaced to maintain a pneumoperitoneum.  Once this was reestablished,  the anterior colpotomy was extended anteriorly to approximately 5  o'clock.  This was repeated on the patient's left and the incision on  the ring was extended to approximately 8 o'clock.  Please note that the  uterus was difficult to manipulate.  The colpotomy incision was  completed.  The uterus was then removed.  The pneumo-occluder was then  reinsufflated.  The vaginal cuff was closed with a series of figure-of-  eight sutures 0 Vicryl using the Endostitch.  The pelvis was then  irrigated.  Individual bleeding points were then cauterized with the  gyrus or the monopolar shears.  The intra-abdominal pressure was then  dropped to approximately 7-8.  All the pedicles were inspected for  hemostasis and there was no active bleeding noted.  Please note that   indigo carmine had been given intravenously and there was no spillage of  the indigo carmine.  All the instruments were then removed from the  abdomen.  The fascial incisions for the 10-mm infraumbilical port and  the suprapubic ports were closed using sutures of 0 Vicryl.  The skin  was closed using 3-0 Vicryl and Dermabond.  A speculum was placed in the  vagina and there was no active bleeding.  At the close of the procedure,  the instrument and pack counts said to be correct x2.  The patient was  taken to the PACU, awake and in stable condition.      Roseanna Rainbow, M.D.  Electronically Signed     LAJ/MEDQ  D:  02/13/2008  T:  02/14/2008  Job:  [id number]

## 2010-11-29 NOTE — H&P (Signed)
NAMEJULAINE, Ashlee Hardy             ACCOUNT NO.:  [id number]   MEDICAL RECORD NO.:  [id number]          PATIENT TYPE:  AMB   LOCATION:  SDC                           [facility]ACILITY:  [facility]   PHYSICIAN:  Roseanna Rainbow, M.D.DATE OF BIRTH:  [birth year]   DATE OF ADMISSION:  DATE OF DISCHARGE:                              HISTORY & PHYSICAL   CHIEF COMPLAINT:  The patient is a 40 year old with menorrhagia for a  total laparoscopic hysterectomy.   HISTORY OF PRESENT ILLNESS:  The patient has a long history of  menorrhagia.  She has completed her childbearing and is status post a  bilateral tubal ligation.  She also has a history of lower abdominal and  pelvic pain.  Her workup to date has included an ultrasound performed in  March 2009 that was a normal periovulatory study and a hemoglobin in  March was 11.5. PT and PTT was normal as well.   PAST GYNECOLOGIC HISTORY:  She has biopsy-proven CIN-1.   PAST MEDICAL HISTORY:  She has a history of a heart murmur and per her  report requires SBE prophylaxis.   PAST SURGICAL HISTORY:  Please see the above.  She also has a history of  a cesarean delivery.   SOCIAL HISTORY:  She is a Geophysicist/field seismologist.  She is married, living  with her spouse.  Does not give any significant history of alcohol use.  She denies any significant smoking history and denies illicit drug use.   FAMILY HISTORY:  Remarkable for chronic alcoholism, hypertension,  hyperthyroidism, hypothyroidism and migraine headaches.   PAST OBSTETRICAL HISTORY:  She is status post 3 vaginal deliveries and a  cesarean delivery.   ALLERGIES:  No known drug allergies.   MEDICATIONS:  Please see the medication reconciliation form.   PHYSICAL EXAMINATION:  VITAL SIGNS:  Temperature 98.7, pulse 70, blood  pressure 112/60.  GENERAL:  Well-developed, African-American female in no apparent  distress.  LUNGS:  Clear to auscultation bilaterally.  HEART:  Regular rate and rhythm.  ABDOMEN:  No organomegaly.  Nontender.  PELVIC:  EXAM shows normal external genitalia.  On speculum exam of the  vagina is clean.  Bimanual exam:  The uterus is anteverted, upper limits  of normal size, nontender.  The adnexa are nonpalpable, nontender.   ASSESSMENT:  Menorrhagia, pelvic pain, very low suspicion for possible  endometriosis.   PLAN:  The planned procedure is a total laparoscopic hysterectomy,  possible laparoscopic-assisted vaginal hysterectomy.  The risks,  benefits and alternative forms of management were reviewed with the  patient, and informed consent had been obtained.      Roseanna Rainbow, M.D.  Electronically Signed     LAJ/MEDQ  D:  02/12/2008  T:  02/12/2008  Job:  [id number]

## 2010-12-02 NOTE — Op Note (Signed)
[facility]  Patient:    Ashlee Hardy, Ashlee Hardy Visit Number: [id number] MRN: [id number]          Service Type: DSU Location: [facility] Attending Physician:  Amada Kingfisher. Dictated by:   Argentina Donovan, M.D. Proc. Date: 01/13/02 Admit Date:  01/13/2002                             Operative Report  PROCEDURE:                    Laparoscopic sterilization.  PREOPERATIVE DIAGNOSES:       Voluntary sterilization.  POSTOPERATIVE DIAGNOSES:      Voluntary sterilization.  PROCEDURE AS FOLLOWS:         Under satisfactory general anesthesia with the patient in a dorsal semilithotomy position, the perineum, vagina, and abdomen were prepped and draped in the usual sterile manner.  Bimanual pelvic examination under anesthesia revealed a uterus of normal size, shape, consistency, retroverted second degree.  The adnexa were free.  A weighted speculum was placed in the posterior fourchette of the vagina.  Anterior lip of the cervix was grasped with a single-tooth tenaculum.  Acorn cannula placed in the cervix, attached to the tenaculum for mobilization of the uterus. Veress needle inserted into the peritoneal cavity at the lower pole of the umbilicus and 1 cm transverse incision made just below the umbilicus.  CO2 of 3 L approximately were insufflated into the peritoneal cavity using the usual precautions to make sure we were in the peritoneum and not in bowel or blood vessel.  The laparoscopic trocar was inserted into the peritoneal cavity.  A trocar removed from the sleeve.  The laparoscope inserted.  Pelvic organs easily visualized.  Manipulation was necessary because of the deep retroversion and each tube grasped in the mid portion and coagulated under bipolar cautery until blanching occurred.  This was done three times in the same spot in each tube.  On the left tube because of the thickness because of some hydrosalpinx, additional coagulation was done to the area  of the cornua. The area was observed.  No bleeding or injury to other bowel or viscus was noted.  The CO2 as much as possible was expressed from the sleeve, the sleeve removed.  The scope was removed.  The incision closed with a 3-0 Vicryl suture on an atraumatic needle.  Dry, sterile dressing was applied.  Tenaculum and speculum were removed from the vagina.  The patient was transferred to the recovery room in satisfactory condition. Dictated by:   Argentina Donovan, M.D. Attending Physician:  Amada Kingfisher. DD:  01/13/02 TD:  01/14/02 Job: [id number]

## 2010-12-02 NOTE — H&P (Signed)
Ashlee Hardy             ACCOUNT NO.:  [id number]   MEDICAL RECORD NO.:  [id number]          PATIENT TYPE:  INP   LOCATION:  9166                          FACILITY:  [facility]   PHYSICIAN:  Ashlee Hardy, M.D.DATE OF BIRTH:  25-Sep-1970   DATE OF ADMISSION:  06/19/2006  DATE OF DISCHARGE:                              HISTORY & PHYSICAL   CHIEF COMPLAINT:  The patient is a 40 year old para 3 with an estimated  date of confinement of June 11, 2006, with an intrauterine pregnancy  at 41+ weeks, for induction of labor secondary to postdates.   HISTORY OF PRESENT ILLNESS:  Please see the above.   ALLERGIES:  No known drug allergies.   MEDICATIONS:  Prenatal vitamins.   RISK FACTORS:  Previous small-for-gestational-age infant, previous  cesarean delivery, advanced maternal age.   PRENATAL LABORATORY DATA:  Blood type is O positive, antibody screen  negative.  Chlamydia negative.  Urine culture and sensitivity:  No  growth.  One-hour GCT 107.  GC negative.  Hepatitis B surface antigen  negative.  Hematocrit 35.2, hemoglobin 11.5.  HIV nonreactive.  Platelets 246,000.  RPR nonreactive.  Rubella immune.  Sickle cell  negative.   PAST GYNECOLOGICAL HISTORY:  She has a history of an abnormal Pap smear  and colposcopy.   PAST MEDICAL HISTORY:  She has a history of a heart murmur and per her  report, she requires SBE prophylaxis.   PAST SURGICAL HISTORY:  She has had a tubal ligation and a cesarean  delivery.   SOCIAL HISTORY:  She is a Architectural technologist.  She is married, living  with her spouse, does not give any significant history of alcohol use,  states she has no significant smoking history and denies illicit drug  use.   FAMILY HISTORY:  Alcoholism, hypertension, hyperthyroidism,  hypothyroidism, migraine headaches.   PAST OBSTETRICAL HISTORY:  In December of 2000, she was delivered of a  live-born female, 3 pounds 4 ounces, by cesarean delivery.  In July  of  1994, she was delivered of a live-born female, 5 pounds, vaginal delivery.  In November of 1992, she was delivered of a live-born female, 5 pounds,  vaginal delivery.   PHYSICAL EXAM:  VITAL SIGNS:  Stable, afebrile.  ABDOMEN:  Fetal heart tracing reassuring.  PELVIC:  Sterile vaginal exam as per the RN.  The cervix is 1-cm  dilated, 50% effaced.   ASSESSMENT:  Multipara at 41+ weeks with history of a cesarean delivery,  desires a vaginal birth after cesarean section, fetal heart tracing  consistent with fetal well-being, unfavorable Bishop's score.   PLAN:  Admission.  We will begin with mechanical ripening with Foley  bulb placement, to be followed by Pitocin and artificial rupture of  membranes.      Ashlee Hardy, M.D.  Electronically Signed     LAJ/MEDQ  D:  06/19/2006  T:  06/19/2006  Job:  [id number]

## 2011-02-22 ENCOUNTER — Telehealth: Payer: Self-pay | Admitting: Family Medicine

## 2011-02-22 NOTE — Telephone Encounter (Signed)
Received a note from pt's dentist regarding concern for delayed wound healing following dental surgery. Pt's DDS Elizebeth Koller, requested evaluation including screening for diabetes and anemia.

## 2011-03-03 ENCOUNTER — Ambulatory Visit (INDEPENDENT_AMBULATORY_CARE_PROVIDER_SITE_OTHER): Payer: BC Managed Care – PPO | Admitting: Family Medicine

## 2011-03-03 DIAGNOSIS — I8390 Asymptomatic varicose veins of unspecified lower extremity: Secondary | ICD-10-CM

## 2011-03-03 DIAGNOSIS — T148XXD Other injury of unspecified body region, subsequent encounter: Secondary | ICD-10-CM

## 2011-03-03 DIAGNOSIS — T07XXXA Unspecified multiple injuries, initial encounter: Secondary | ICD-10-CM

## 2011-03-03 LAB — LIPID PANEL
HDL: 57 mg/dL (ref >39)
LDL Cholesterol: 134 mg/dL — ABNORMAL HIGH (ref 0–99)
Triglycerides: 69 mg/dL (ref <150)

## 2011-03-03 LAB — CBC
Hemoglobin: 12.8 g/dL (ref 12.0–15.0)
MCH: 29.4 pg (ref 26.0–34.0)
Platelets: 277 10*3/uL (ref 150–400)
RDW: 13.1 % (ref 11.5–15.5)

## 2011-03-03 LAB — POCT GLYCOSYLATED HEMOGLOBIN (HGB A1C): Hemoglobin A1C: 5.6

## 2011-03-03 NOTE — Progress Notes (Signed)
  Subjective:    Patient ID: Ashlee Hardy, female    DOB: 1970/10/03, 40 y.o.   MRN: [id number]  HPI Pt here to f/u ? Impaired wound healing per request of her dental surgeon following what appeared to be delayed wound healing after a procedure. She has a history of gestational diabetes. She has no symptoms of diabetes including no excessive change in weight, no polyuria, polydipsia, excessive thirst. She denies fatigue, easy bruising, palpitations.   Reviewed and updated pertinent past medical history, medications and social history.     Review of Systems As per HPI    Objective:   Physical Exam  Nursing note and vitals reviewed. Constitutional: She appears well-developed and well-nourished.  HENT:  Head: Normocephalic and atraumatic.  Neck: Normal range of motion. Neck supple. No thyromegaly present.  Cardiovascular: Normal rate, regular rhythm and normal heart sounds.   Pulmonary/Chest: Effort normal and breath sounds normal.  Lymphadenopathy:    She has no cervical adenopathy.  Skin: Skin is warm and dry. No rash noted. No erythema. No pallor.          Assessment & Plan:

## 2011-03-03 NOTE — Patient Instructions (Signed)
Mrs. Kalp,  Thank you for coming in today. I will fax the office visit and lab results to your dentist. For your varicose veins: elevated leg, you may also wear a compression hose.   Please drop off the school physical form for [employer] and  I will be happy to fill it out.   -Dr. Armen Pickup

## 2011-03-15 ENCOUNTER — Encounter: Payer: Self-pay | Admitting: Family Medicine

## 2011-03-15 NOTE — Assessment & Plan Note (Addendum)
Imp: ? Delayed wound healing. Here to rule out underlying vasculopathy, anemia or deficiency. History of physical exam do not suggest a specific cause. Blood work is also negative.  Plan: Pt is cleared for continued dental procedure. If she exhibits more features of poor wound healing will re-evaluate.

## 2011-03-17 ENCOUNTER — Encounter: Payer: Self-pay | Admitting: Family Medicine

## 2011-04-07 ENCOUNTER — Encounter: Payer: Self-pay | Admitting: Family Medicine

## 2011-04-14 LAB — ABO/RH: ABO/RH(D): O POS

## 2011-04-14 LAB — CBC
HCT: 31 — ABNORMAL LOW
Hemoglobin: 10.4 — ABNORMAL LOW
Hemoglobin: 11.8 — ABNORMAL LOW
MCHC: 33.2
MCV: 87.2
Platelets: 332
RBC: 3.52 — ABNORMAL LOW
RBC: 4.07
RDW: 14.3
WBC: 11.7 — ABNORMAL HIGH

## 2011-04-14 LAB — BASIC METABOLIC PANEL
BUN: 5 — ABNORMAL LOW
CO2: 28
Calcium: 8.4
GFR calc Af Amer: >60
GFR calc non Af Amer: >60
Glucose, Bld: 156 — ABNORMAL HIGH

## 2011-04-14 LAB — TYPE AND SCREEN: Antibody Screen: NEGATIVE

## 2011-10-25 ENCOUNTER — Telehealth: Payer: Self-pay | Admitting: Family Medicine

## 2011-10-25 DIAGNOSIS — N63 Unspecified lump in unspecified breast: Secondary | ICD-10-CM

## 2011-10-25 NOTE — Telephone Encounter (Signed)
Need a referral for mammogram.  Please contact Ashlee Hardy at earliest convenience.

## 2011-10-26 NOTE — Telephone Encounter (Signed)
Patient is calling back to check on this referral °

## 2011-10-27 ENCOUNTER — Telehealth: Payer: Self-pay | Admitting: Family Medicine

## 2011-10-27 ENCOUNTER — Telehealth: Payer: Self-pay | Admitting: *Deleted

## 2011-10-27 DIAGNOSIS — N63 Unspecified lump in unspecified breast: Secondary | ICD-10-CM

## 2011-10-27 DIAGNOSIS — D241 Benign neoplasm of right breast: Secondary | ICD-10-CM | POA: Insufficient documentation

## 2011-10-27 NOTE — Telephone Encounter (Signed)
Orders in for mammogram and ultrasound.

## 2011-10-27 NOTE — Telephone Encounter (Signed)
R breast has baseline of feeling thicker. Patient noticed an area of increased density/thickness a few days on the R lateral breast.  L breast tender to touch. One day of tingling.   Plan: 1. I asked patient to call in and schedule clinical breast exam at hear earliest convenience. She reported that she would do so but would like to get the mammogram asap.  2. Placed order for mammogram.

## 2011-10-27 NOTE — Telephone Encounter (Signed)
Message left on office voicemail from [place] at [facility].  Orders in Epic need to be updated.   Can call Felia back at [phone]---ext. [phone].  Will route note to Dr. Armen Pickup.

## 2011-10-27 NOTE — Telephone Encounter (Signed)
[facility], since pt has felt a lump she needs a bilateral diagnostic and u/s of the breast that the lump is in.  Dr. Armen Pickup will put in the order and I called and LMOVM that she may call the breast center and schedule her appt .Adalaya Irion, Maryjo [place]

## 2011-10-27 NOTE — Assessment & Plan Note (Signed)
Breast changes have not been evaluated my provider yet. No previous mammograms.  Plan:  -pt to call for appt for clinical breast exam.  -referral for screening mammogram.

## 2011-10-30 NOTE — Telephone Encounter (Signed)
Returned call. Changed order to bilateral diagnostic mammogram.

## 2011-11-09 ENCOUNTER — Ambulatory Visit
Admission: RE | Admit: 2011-11-09 | Discharge: 2011-11-09 | Disposition: A | Discharge status: Home / Self Care | Payer: BC Managed Care – PPO | Source: Ambulatory Visit | Attending: Family Medicine | Admitting: Family Medicine

## 2011-11-09 ENCOUNTER — Other Ambulatory Visit: Payer: Self-pay | Admitting: Family Medicine

## 2011-11-09 DIAGNOSIS — N63 Unspecified lump in unspecified breast: Secondary | ICD-10-CM

## 2011-11-14 ENCOUNTER — Telehealth: Payer: Self-pay | Admitting: Family Medicine

## 2011-11-14 ENCOUNTER — Encounter: Payer: Self-pay | Admitting: Family Medicine

## 2011-11-14 DIAGNOSIS — D241 Benign neoplasm of right breast: Secondary | ICD-10-CM

## 2011-11-14 NOTE — Telephone Encounter (Signed)
Message copied by Dessa Phi on Tue Nov 14, 2011  2:25 PM ------      Message from: Zachery Dauer      Created: Thu Nov 09, 2011  4:57 PM                   ----- Message -----         From: Rad Results In Interface         Sent: 11/09/2011   4:46 PM           To: Tobin Chad, MD

## 2011-11-14 NOTE — Telephone Encounter (Signed)
Called patient. Reviewed R breast ultrasound -likely fibroadenoma, plan to repeat ultrasound in 6 months (October 2013).

## 2011-11-14 NOTE — Assessment & Plan Note (Addendum)
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 Recommendation is repeat ultrasound in 6 months. Patient informed and agrees with plan.

## 2012-04-19 ENCOUNTER — Telehealth: Payer: Self-pay | Admitting: Family Medicine

## 2012-05-28 ENCOUNTER — Other Ambulatory Visit: Payer: Self-pay | Admitting: Family Medicine

## 2012-05-28 DIAGNOSIS — D249 Benign neoplasm of unspecified breast: Secondary | ICD-10-CM

## 2012-06-06 ENCOUNTER — Ambulatory Visit
Admission: RE | Admit: 2012-06-06 | Discharge: 2012-06-06 | Disposition: A | Discharge status: Home / Self Care | Payer: BC Managed Care – PPO | Source: Ambulatory Visit | Attending: Family Medicine | Admitting: Family Medicine

## 2012-06-06 DIAGNOSIS — D249 Benign neoplasm of unspecified breast: Secondary | ICD-10-CM

## 2013-05-23 ENCOUNTER — Other Ambulatory Visit: Payer: Self-pay | Admitting: Family Medicine

## 2013-05-23 DIAGNOSIS — D241 Benign neoplasm of right breast: Secondary | ICD-10-CM

## 2013-06-09 ENCOUNTER — Other Ambulatory Visit: Payer: Self-pay | Admitting: Family Medicine

## 2013-06-09 ENCOUNTER — Other Ambulatory Visit: Payer: Self-pay

## 2013-06-09 DIAGNOSIS — D241 Benign neoplasm of right breast: Secondary | ICD-10-CM

## 2013-06-11 ENCOUNTER — Other Ambulatory Visit: Payer: Self-pay | Admitting: Family Medicine

## 2013-06-11 DIAGNOSIS — D241 Benign neoplasm of right breast: Secondary | ICD-10-CM

## 2013-06-13 ENCOUNTER — Other Ambulatory Visit: Payer: Self-pay | Admitting: Family Medicine

## 2013-06-13 DIAGNOSIS — D241 Benign neoplasm of right breast: Secondary | ICD-10-CM

## 2013-06-16 ENCOUNTER — Other Ambulatory Visit: Payer: Self-pay | Admitting: Family Medicine

## 2013-06-16 ENCOUNTER — Ambulatory Visit
Admission: RE | Admit: 2013-06-16 | Discharge: 2013-06-16 | Disposition: A | Discharge status: Home / Self Care | Payer: BC Managed Care – PPO | Source: Ambulatory Visit | Attending: Family Medicine | Admitting: Family Medicine

## 2013-06-16 DIAGNOSIS — D241 Benign neoplasm of right breast: Secondary | ICD-10-CM

## 2013-07-14 ENCOUNTER — Encounter: Payer: Self-pay | Admitting: Family Medicine

## 2013-12-03 ENCOUNTER — Other Ambulatory Visit: Payer: Self-pay | Admitting: Family Medicine

## 2013-12-03 DIAGNOSIS — N63 Unspecified lump in unspecified breast: Secondary | ICD-10-CM

## 2013-12-04 ENCOUNTER — Other Ambulatory Visit: Payer: Self-pay | Admitting: Family Medicine

## 2013-12-04 ENCOUNTER — Other Ambulatory Visit: Payer: Self-pay

## 2013-12-04 DIAGNOSIS — N63 Unspecified lump in unspecified breast: Secondary | ICD-10-CM

## 2013-12-09 ENCOUNTER — Other Ambulatory Visit: Payer: Self-pay | Admitting: Family Medicine

## 2013-12-09 DIAGNOSIS — N63 Unspecified lump in unspecified breast: Secondary | ICD-10-CM

## 2013-12-10 ENCOUNTER — Ambulatory Visit
Admission: RE | Admit: 2013-12-10 | Discharge: 2013-12-10 | Disposition: A | Discharge status: Home / Self Care | Payer: BC Managed Care – PPO | Source: Ambulatory Visit | Attending: Family Medicine | Admitting: Family Medicine

## 2013-12-10 DIAGNOSIS — N63 Unspecified lump in unspecified breast: Secondary | ICD-10-CM

## 2014-05-02 ENCOUNTER — Encounter (HOSPITAL_COMMUNITY): Payer: Self-pay | Admitting: Emergency Medicine

## 2014-05-02 ENCOUNTER — Ambulatory Visit (HOSPITAL_COMMUNITY): Payer: BC Managed Care – PPO | Attending: Physician Assistant

## 2014-05-02 ENCOUNTER — Emergency Department (INDEPENDENT_AMBULATORY_CARE_PROVIDER_SITE_OTHER)
Admission: EM | Admit: 2014-05-02 | Discharge: 2014-05-02 | Disposition: A | Discharge status: Home / Self Care | Payer: BC Managed Care – PPO | Source: Home / Self Care | Attending: Family Medicine | Admitting: Family Medicine

## 2014-05-02 ENCOUNTER — Other Ambulatory Visit (HOSPITAL_COMMUNITY)
Admission: RE | Admit: 2014-05-02 | Discharge: 2014-05-02 | Disposition: A | Discharge status: Home / Self Care | Payer: BC Managed Care – PPO | Source: Ambulatory Visit | Attending: Family Medicine | Admitting: Family Medicine

## 2014-05-02 DIAGNOSIS — R109 Unspecified abdominal pain: Secondary | ICD-10-CM | POA: Diagnosis not present

## 2014-05-02 DIAGNOSIS — M549 Dorsalgia, unspecified: Secondary | ICD-10-CM | POA: Diagnosis present

## 2014-05-02 DIAGNOSIS — Z113 Encounter for screening for infections with a predominantly sexual mode of transmission: Secondary | ICD-10-CM | POA: Diagnosis not present

## 2014-05-02 DIAGNOSIS — R102 Pelvic and perineal pain: Secondary | ICD-10-CM | POA: Diagnosis not present

## 2014-05-02 DIAGNOSIS — Z90710 Acquired absence of both cervix and uterus: Secondary | ICD-10-CM | POA: Diagnosis not present

## 2014-05-02 DIAGNOSIS — N760 Acute vaginitis: Secondary | ICD-10-CM | POA: Diagnosis present

## 2014-05-02 DIAGNOSIS — N83299 Other ovarian cyst, unspecified side: Secondary | ICD-10-CM

## 2014-05-02 DIAGNOSIS — N8329 Other ovarian cysts: Secondary | ICD-10-CM

## 2014-05-02 LAB — POCT URINALYSIS DIP (DEVICE)
BILIRUBIN URINE: NEGATIVE
Glucose, UA: NEGATIVE mg/dL
Hgb urine dipstick: NEGATIVE
Ketones, ur: NEGATIVE mg/dL
LEUKOCYTES UA: NEGATIVE
NITRITE: NEGATIVE
PH: 6 (ref 5.0–8.0)
PROTEIN: NEGATIVE mg/dL
Specific Gravity, Urine: >=1.03 (ref 1.005–1.030)
Urobilinogen, UA: 0.2 mg/dL (ref 0.0–1.0)

## 2014-05-02 LAB — POCT PREGNANCY, URINE: PREG TEST UR: NEGATIVE

## 2014-05-02 NOTE — ED Notes (Signed)
T/C to [place] - pt will be ready to return in approx 15 min.

## 2014-05-02 NOTE — Discharge Instructions (Signed)
I suspect given above ultrasound report you are experiencing pelvic irritation from leaking ovarian cyst. Advise the use of tylenol or naprosyn as directed on packaging for discomfort and follow up with ObGyn in the next several days. Will likely need repeat U/S in next 1-2 weeks. Advise that if symptoms become suddenly worse or severe, she should report to her nearest ER for evaluation. Ovarian Cyst An ovarian cyst is a fluid-filled sac that forms on an ovary. The ovaries are small organs that produce eggs in women. Various types of cysts can form on the ovaries. Most are not cancerous. Many do not cause problems, and they often go away on their own. Some may cause symptoms and require treatment. Common types of ovarian cysts include:  Functional cysts--These cysts may occur every month during the menstrual cycle. This is normal. The cysts usually go away with the next menstrual cycle if the woman does not get pregnant. Usually, there are no symptoms with a functional cyst.  Endometrioma cysts--These cysts form from the tissue that lines the uterus. They are also called "chocolate cysts" because they become filled with blood that turns brown. This type of cyst can cause pain in the lower abdomen during intercourse and with your menstrual period.  Cystadenoma cysts--This type develops from the cells on the outside of the ovary. These cysts can get very big and cause lower abdomen pain and pain with intercourse. This type of cyst can twist on itself, cut off its blood supply, and cause severe pain. It can also easily rupture and cause a lot of pain.  Dermoid cysts--This type of cyst is sometimes found in both ovaries. These cysts may contain different kinds of body tissue, such as skin, teeth, hair, or cartilage. They usually do not cause symptoms unless they get very big.  Theca lutein cysts--These cysts occur when too much of a certain hormone (human chorionic gonadotropin) is produced and  overstimulates the ovaries to produce an egg. This is most common after procedures used to assist with the conception of a baby (in vitro fertilization). CAUSES   Fertility drugs can cause a condition in which multiple large cysts are formed on the ovaries. This is called ovarian hyperstimulation syndrome.  A condition called polycystic ovary syndrome can cause hormonal imbalances that can lead to nonfunctional ovarian cysts. SIGNS AND SYMPTOMS  Many ovarian cysts do not cause symptoms. If symptoms are present, they may include:  Pelvic pain or pressure.  Pain in the lower abdomen.  Pain during sexual intercourse.  Increasing girth (swelling) of the abdomen.  Abnormal menstrual periods.  Increasing pain with menstrual periods.  Stopping having menstrual periods without being pregnant. DIAGNOSIS  These cysts are commonly found during a routine or annual pelvic exam. Tests may be ordered to find out more about the cyst. These tests may include:  Ultrasound.  X-ray of the pelvis.  CT scan.  MRI.  Blood tests. TREATMENT  Many ovarian cysts go away on their own without treatment. Your health care provider may want to check your cyst regularly for 2-3 months to see if it changes. For women in menopause, it is particularly important to monitor a cyst closely because of the higher rate of ovarian cancer in menopausal women. When treatment is needed, it may include any of the following:  A procedure to drain the cyst (aspiration). This may be done using a long needle and ultrasound. It can also be done through a laparoscopic procedure. This involves using a thin, lighted  tube with a tiny camera on the end (laparoscope) inserted through a small incision.  Surgery to remove the whole cyst. This may be done using laparoscopic surgery or an open surgery involving a larger incision in the lower abdomen.  Hormone treatment or birth control pills. These methods are sometimes used to help  dissolve a cyst. HOME CARE INSTRUCTIONS   Only take over-the-counter or prescription medicines as directed by your health care provider.  Follow up with your health care provider as directed.  Get regular pelvic exams and Pap tests. SEEK MEDICAL CARE IF:   Your periods are late, irregular, or painful, or they stop.  Your pelvic pain or abdominal pain does not go away.  Your abdomen becomes larger or swollen.  You have pressure on your bladder or trouble emptying your bladder completely.  You have pain during sexual intercourse.  You have feelings of fullness, pressure, or discomfort in your stomach.  You lose weight for no apparent reason.  You feel generally ill.  You become constipated.  You lose your appetite.  You develop acne.  You have an increase in body and facial hair.  You are gaining weight, without changing your exercise and eating habits.  You think you are pregnant. SEEK IMMEDIATE MEDICAL CARE IF:   You have increasing abdominal pain.  You feel sick to your stomach (nauseous), and you throw up (vomit).  You develop a fever that comes on suddenly.  You have abdominal pain during a bowel movement.  Your menstrual periods become heavier than usual. MAKE SURE YOU:  Understand these instructions.  Will watch your condition.  Will get help right away if you are not doing well or get worse. Document Released: 07/03/2005 Document Revised: 07/08/2013 Document Reviewed: 03/10/2013 [facility] Patient Information 2015 [place], [place]. This information is not intended to replace advice given to you by your health care provider. Make sure you discuss any questions you have with your health care provider.

## 2014-05-02 NOTE — ED Notes (Signed)
Pt to [place] dept.

## 2014-05-02 NOTE — ED Notes (Signed)
Pt remains in [place] dept.

## 2014-05-02 NOTE — ED Notes (Addendum)
Started with right hip pain 2-3 wks ago without any injury.  6 days ago started with left hip pain, and pain across lower back, and intermittent right low abd pain.  Today has primarily had low back pain, but now left hip and right low abd hurting also.  Denies any injuries, parasthesias, weakness, incontinence, UTI sxs, vag discharge, fevers.  Pain worse with walking.

## 2014-05-02 NOTE — ED Provider Notes (Signed)
CSN: [id number]     Arrival date & time 05/02/14  1518 History   First MD Initiated Contact with Patient 05/02/14 1623     Chief Complaint  Patient presents with  . Back Pain  . Abdominal Pain   (Consider location/radiation/quality/duration/timing/severity/associated sxs/prior Treatment) HPI  Past Medical History  Diagnosis Date  . Gestational diabetes     when pregnant with son   Past Surgical History  Procedure Laterality Date  . Laparoscopic total hysterectomy  02/13/2008    for menorrhagia done at [facility]  . Cesarean section     Family History  Problem Relation Age of Onset  . Diabetes Father    History  Substance Use Topics  . Smoking status: Never Smoker   . Smokeless tobacco: Never Used  . Alcohol Use: No   OB History   Grav Para Term Preterm Abortions TAB SAB Ect Mult Living                 Review of Systems  All other systems reviewed and are negative.   Allergies  Review of patient's allergies indicates no known allergies.  Home Medications   Prior to Admission medications   Not on File   BP 115/69  Pulse 73  Temp(Src) 97.5 F (36.4 C) (Oral)  Resp 18  SpO2 99% Physical Exam  Nursing note and vitals reviewed. Constitutional: She is oriented to person, place, and time. She appears well-developed and well-nourished. No distress.  HENT:  Head: Normocephalic and atraumatic.  Eyes: Conjunctivae are normal. No scleral icterus.  Cardiovascular: Normal rate, regular rhythm and normal heart sounds.   Pulmonary/Chest: Effort normal and breath sounds normal. No respiratory distress. She has no wheezes.  Abdominal: Hernia confirmed negative in the right inguinal area and confirmed negative in the left inguinal area.  Genitourinary: Vagina normal. Pelvic exam was performed with patient supine. There is no rash, tenderness or lesion on the right labia. There is no rash, tenderness or lesion on the left labia. Right adnexum displays tenderness. Right adnexum  displays no mass and no fullness. Left adnexum displays tenderness. Left adnexum displays no mass and no fullness.  Mild bilateral adnexal tenderness (R>L). No palpable masses. Patient s/p hyterectomy  Musculoskeletal: Normal range of motion.  Lymphadenopathy:       Right: No inguinal adenopathy present.       Left: No inguinal adenopathy present.  Neurological: She is alert and oriented to person, place, and time.  Skin: Skin is warm and dry. No rash noted. No erythema.  Psychiatric: She has a normal mood and affect. Her behavior is normal.    ED Course  Procedures (including critical care time) Labs Review Labs Reviewed  POCT URINALYSIS DIP (DEVICE)  POCT PREGNANCY, URINE  CERVICOVAGINAL ANCILLARY ONLY    Imaging Review US Transvaginal Non-ob  05/02/2014   CLINICAL DATA:  Initial evaluation for back pain abdominal pain pelvic pain, history of hysterectomy  EXAM: TRANSABDOMINAL AND TRANSVAGINAL ULTRASOUND OF PELVIS  TECHNIQUE: Both transabdominal and transvaginal ultrasound examinations of the pelvis were performed. Transabdominal technique was performed for global imaging of the pelvis including uterus, ovaries, adnexal regions, and pelvic cul-de-sac. It was necessary to proceed with endovaginal exam following the transabdominal exam to visualize the ovaries.  COMPARISON:  None  FINDINGS: The uterus is surgically absent.  Right ovary  Measurements: 48 x 31 x 32 mm. Multiple simple cysts, the largest measuring 16 x 17 x 18 mm.  Left ovary  Measurements: 30 x 19 x  16 mm. Left ovary is difficult to visualize due to overlying bowel gas.  Other findings  Small volume free fluid in the pelvis.  IMPRESSION: Numerous right ovarian cysts.  Small volume free fluid.   Electronically Signed   By: Skipper Cliche M.D.   On: 05/02/2014 19:13   US Pelvis Complete  05/02/2014   CLINICAL DATA:  Initial evaluation for back pain abdominal pain pelvic pain, history of hysterectomy  EXAM: TRANSABDOMINAL AND  TRANSVAGINAL ULTRASOUND OF PELVIS  TECHNIQUE: Both transabdominal and transvaginal ultrasound examinations of the pelvis were performed. Transabdominal technique was performed for global imaging of the pelvis including uterus, ovaries, adnexal regions, and pelvic cul-de-sac. It was necessary to proceed with endovaginal exam following the transabdominal exam to visualize the ovaries.  COMPARISON:  None  FINDINGS: The uterus is surgically absent.  Right ovary  Measurements: 48 x 31 x 32 mm. Multiple simple cysts, the largest measuring 16 x 17 x 18 mm.  Left ovary  Measurements: 30 x 19 x 16 mm. Left ovary is difficult to visualize due to overlying bowel gas.  Other findings  Small volume free fluid in the pelvis.  IMPRESSION: Numerous right ovarian cysts.  Small volume free fluid.   Electronically Signed   By: Skipper Cliche M.D.   On: 05/02/2014 19:13     MDM   1. Physiological ovarian cysts    I suspect given above ultrasound report patient is experiencing pelvic irritation from leaking ovarian cyst. Appears medically and hemodynamically stable. Will advise the use of tylenol or naprosyn as directed on packaging for discomfort and follow up with her ObGyn in the next several days. Will likely need repeat U/S in next 1-2 weeks. Will advise patient that if symptoms become suddenly worse or severe, she should report to her nearest ER for evaluation.     Lutricia Feil, [place] 05/02/14 1944

## 2014-05-04 LAB — CERVICOVAGINAL ANCILLARY ONLY
Chlamydia: NEGATIVE
NEISSERIA GONORRHEA: NEGATIVE
Wet Prep (BD Affirm): NEGATIVE
Wet Prep (BD Affirm): NEGATIVE
Wet Prep (BD Affirm): POSITIVE — AB

## 2014-05-06 ENCOUNTER — Telehealth (HOSPITAL_COMMUNITY): Payer: Self-pay | Admitting: *Deleted

## 2014-05-06 MED ORDER — FLUCONAZOLE 150 MG PO TABS: 150.0000 mg | ORAL_TABLET | Freq: Every day | ORAL | Status: AC

## 2014-05-06 NOTE — ED Notes (Signed)
GC/Chlamydia neg., Affirm: Candida pos., Gardnerella and Trich neg. 10/20 Message sent to Dot Been NP.  10/21  She e-prescribed Diflucan to pt.'s pharmacy. I called and left a message to call. Call 1. Roselyn Meier .05/06/2014

## 2014-05-06 NOTE — ED Notes (Signed)
Pt. called back.  Pt. verified x 2 and given results. Pt.told she has a Rx. of Diflucan for the yeast infection and where to pick it up.  She said she does not have symptoms.  Truman Hayward said to tell her she does not have to take it, but it is available if she needs it. Pt. notified.  Roselyn Meier 05/06/2014

## 2014-05-06 NOTE — ED Provider Notes (Signed)
Medical screening examination/treatment/procedure(s) were performed by resident physician or non-physician practitioner and as supervising physician I was immediately available for consultation/collaboration.   Pauline Good MD.   Billy Fischer, MD 05/06/14 2007

## 2014-09-08 ENCOUNTER — Ambulatory Visit (INDEPENDENT_AMBULATORY_CARE_PROVIDER_SITE_OTHER): Payer: BC Managed Care – PPO | Admitting: Family Medicine

## 2014-09-08 ENCOUNTER — Encounter: Payer: Self-pay | Admitting: Family Medicine

## 2014-09-08 VITALS — BP 129/59 | HR 75 | Temp 98.6°F | Ht 69.0 in | Wt 185.2 lb

## 2014-09-08 DIAGNOSIS — R1031 Right lower quadrant pain: Secondary | ICD-10-CM | POA: Insufficient documentation

## 2014-09-08 NOTE — Assessment & Plan Note (Signed)
Doubt appendicitis - no fevers, vomiting, unremarkable exam No urinary or GI symptoms S/p hysterectomy - so not pregnant Could be ovarian cyst, but last transvag US with physiologic cysts Gave red flags and return precautions F/u with PCP

## 2014-09-08 NOTE — Patient Instructions (Signed)
It was nice to meet you today.  There are many things that can cause pain in the right side of your belly, but you don't have symptoms of anything bad.  This could be another ovarian cyst, but those are normal and there is nothing to be done about it.  Seek care if your pain escalates, if you have vomiting or fevers, or become concerned.  Take Care, Dr. B  Abdominal Pain, Women Abdominal (stomach, pelvic, or belly) pain can be caused by many things. It is important to tell your doctor:  The location of the pain.  Does it come and go or is it present all the time?  Are there things that start the pain (eating certain foods, exercise)?  Are there other symptoms associated with the pain (fever, nausea, vomiting, diarrhea)? All of this is helpful to know when trying to find the cause of the pain. CAUSES   Stomach: virus or bacteria infection, or ulcer.  Intestine: appendicitis (inflamed appendix), regional ileitis (Crohn's disease), ulcerative colitis (inflamed colon), irritable bowel syndrome, diverticulitis (inflamed diverticulum of the colon), or cancer of the stomach or intestine.  Gallbladder disease or stones in the gallbladder.  Kidney disease, kidney stones, or infection.  Pancreas infection or cancer.  Fibromyalgia (pain disorder).  Diseases of the female organs:  Uterus: fibroid (non-cancerous) tumors or infection.  Fallopian tubes: infection or tubal pregnancy.  Ovary: cysts or tumors.  Pelvic adhesions (scar tissue).  Endometriosis (uterus lining tissue growing in the pelvis and on the pelvic organs).  Pelvic congestion syndrome (female organs filling up with blood just before the menstrual period).  Pain with the menstrual period.  Pain with ovulation (producing an egg).  Pain with an IUD (intrauterine device, birth control) in the uterus.  Cancer of the female organs.  Functional pain (pain not caused by a disease, may improve without  treatment).  Psychological pain.  Depression. DIAGNOSIS  Your doctor will decide the seriousness of your pain by doing an examination.  Blood tests.  X-rays.  Ultrasound.  CT scan (computed tomography, special type of X-ray).  MRI (magnetic resonance imaging).  Cultures, for infection.  Barium enema (dye inserted in the large intestine, to better view it with X-rays).  Colonoscopy (looking in intestine with a lighted tube).  Laparoscopy (minor surgery, looking in abdomen with a lighted tube).  Major abdominal exploratory surgery (looking in abdomen with a large incision). TREATMENT  The treatment will depend on the cause of the pain.   Many cases can be observed and treated at home.  Over-the-counter medicines recommended by your caregiver.  Prescription medicine.  Antibiotics, for infection.  Birth control pills, for painful periods or for ovulation pain.  Hormone treatment, for endometriosis.  Nerve blocking injections.  Physical therapy.  Antidepressants.  Counseling with a psychologist or psychiatrist.  Minor or major surgery. HOME CARE INSTRUCTIONS   Do not take laxatives, unless directed by your caregiver.  Take over-the-counter pain medicine only if ordered by your caregiver. Do not take aspirin because it can cause an upset stomach or bleeding.  Try a clear liquid diet (broth or water) as ordered by your caregiver. Slowly move to a bland diet, as tolerated, if the pain is related to the stomach or intestine.  Have a thermometer and take your temperature several times a day, and record it.  Bed rest and sleep, if it helps the pain.  Avoid sexual intercourse, if it causes pain.  Avoid stressful situations.  Keep your follow-up appointments  and tests, as your caregiver orders.  If the pain does not go away with medicine or surgery, you may try:  Acupuncture.  Relaxation exercises (yoga, meditation).  Group therapy.  Counseling. SEEK  MEDICAL CARE IF:   You notice certain foods cause stomach pain.  Your home care treatment is not helping your pain.  You need stronger pain medicine.  You want your IUD removed.  You feel faint or lightheaded.  You develop nausea and vomiting.  You develop a rash.  You are having side effects or an allergy to your medicine. SEEK IMMEDIATE MEDICAL CARE IF:   Your pain does not go away or gets worse.  You have a fever.  Your pain is felt only in portions of the abdomen. The right side could possibly be appendicitis. The left lower portion of the abdomen could be colitis or diverticulitis.  You are passing blood in your stools (bright red or black tarry stools, with or without vomiting).  You have blood in your urine.  You develop chills, with or without a fever.  You pass out. MAKE SURE YOU:   Understand these instructions.  Will watch your condition.  Will get help right away if you are not doing well or get worse. Document Released: 04/30/2007 Document Revised: 11/17/2013 Document Reviewed: 05/20/2009 [facility] Patient Information 2015 [place], [place]. This information is not intended to replace advice given to you by your health care provider. Make sure you discuss any questions you have with your health care provider.

## 2014-09-08 NOTE — Progress Notes (Signed)
   Subjective:   Ashlee Hardy is a 44 y.o. female with a history of multiple ovarian cysts in R ovary here for RLQ pain.  Patient reports RLQ pain starting yesterday without inciting event.  Pain is worse when standing from sitting position and nontender at all while sitting. Reports that pain is better when walking around. She reports she had ovarian cysts in the right ovary in 04/2014 that improved. This pain does not feel the same as that pain. She denies any fevers, nausea, vomiting, diarrhea, dysuria, hematuria, melena, blood per rectum. She reports that she is intermittently mildly constipated but does not feel constituted currently. She has been tried any medication for her pain. She is sexually active with her husband.   Review of Systems:  Per HPI. All other systems reviewed and are negative.   PMH, PSH, Medications, Allergies, and FmHx reviewed and updated in EMR.  Social History: never smoker  Objective:  BP 129/59 mmHg  Pulse 75  Temp(Src) 98.6 F (37 C) (Oral)  Ht 5\' 9"  (1.753 m)  Wt 185 lb 3.2 oz (84.006 kg)  BMI 27.34 kg/m2  Gen:  44 y.o. female in NAD HEENT: NCAT, MMM, EOMI, PERRL, anicteric sclerae CV: RRR, no MRG, no JVD Resp: Non-labored, CTAB, no wheezes noted Abd: Soft, ND, mild suprapubic tenderness but patient reports full bladder, BS present, no guarding/rebound or organomegaly Ext: WWP, no edema Neuro: Alert and oriented, speech normal    Assessment:     Ashlee Hardy is a 44 y.o. female here for RLQ pain.    Plan:     See problem list for problem-specific plans.   Lavon Paganini, MD PGY-1,  [facility] 09/08/2014  3:16 PM

## 2014-12-03 IMAGING — US US TRANSVAGINAL NON-OB
1 series · 14 of 25 positions shown · non-contrast
Comparison: None

CLINICAL DATA: Initial evaluation for back pain abdominal pain
pelvic pain, history of hysterectomy



[Series 1: us transvaginal non-ob · 0.18mm/px · 14 of 59 slices shown]
[im 1/59]
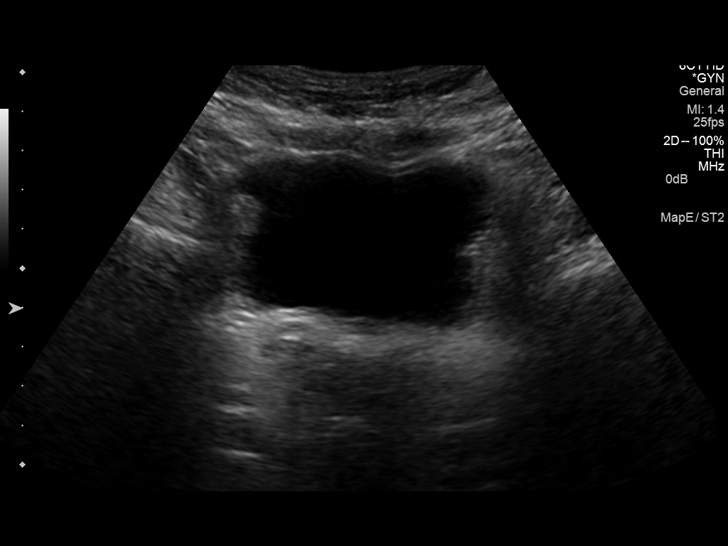
[im 5/59]
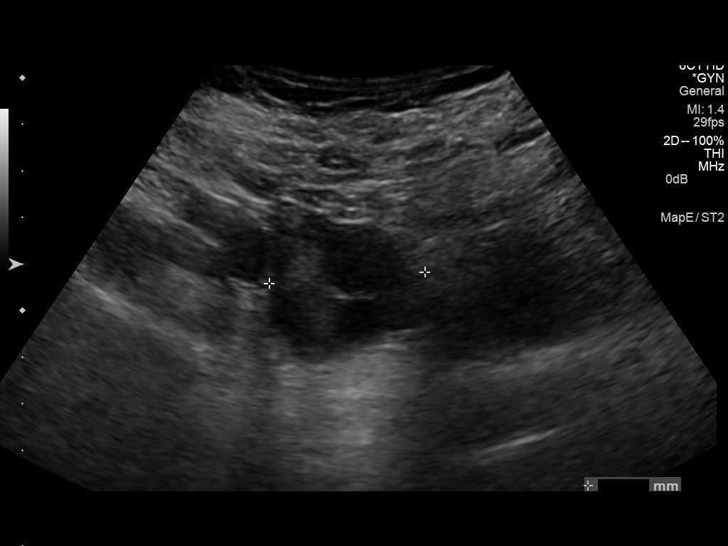
[im 10/59]
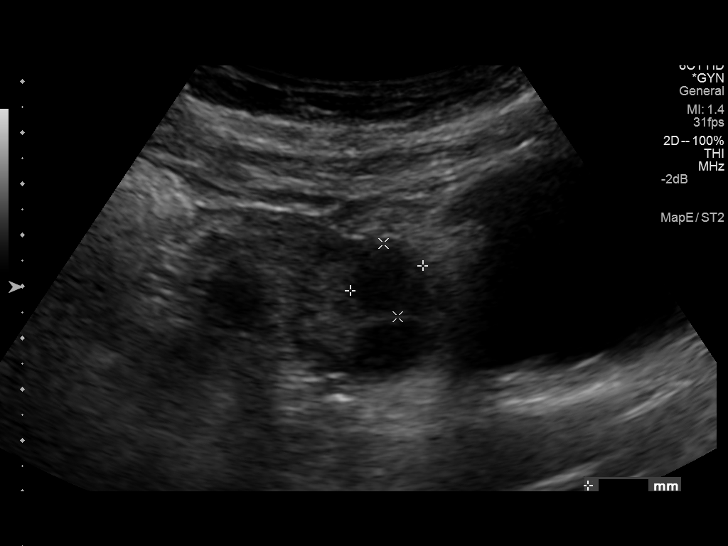
[im 15/59]
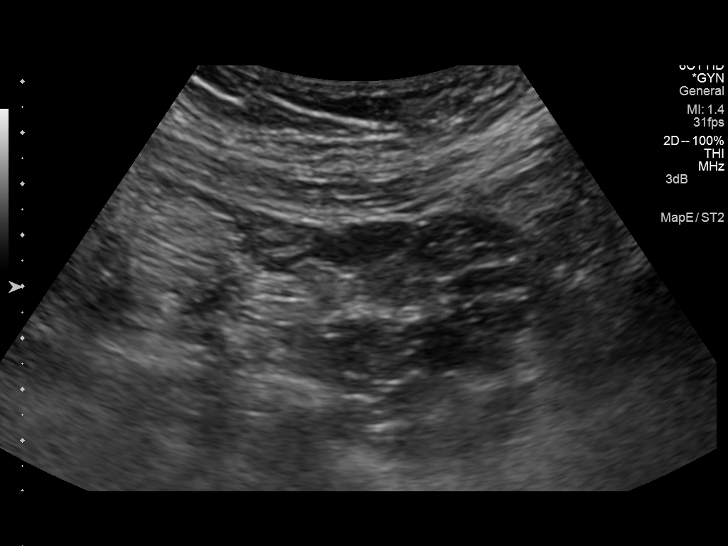
[im 20/59]
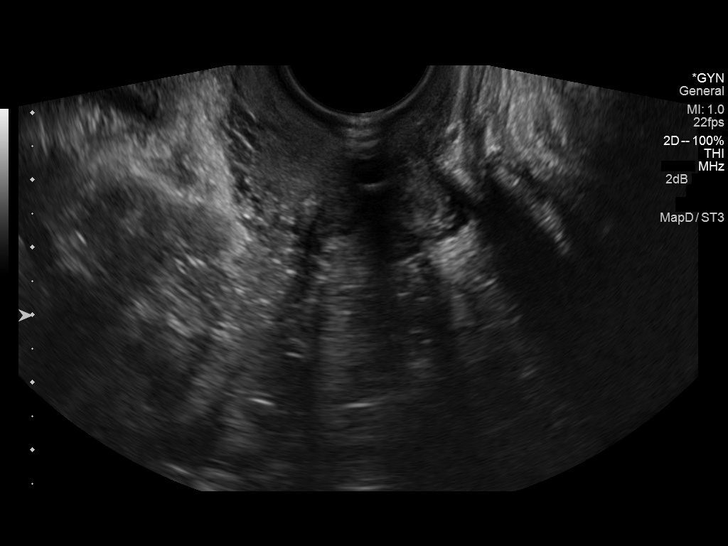
[im 22/59]
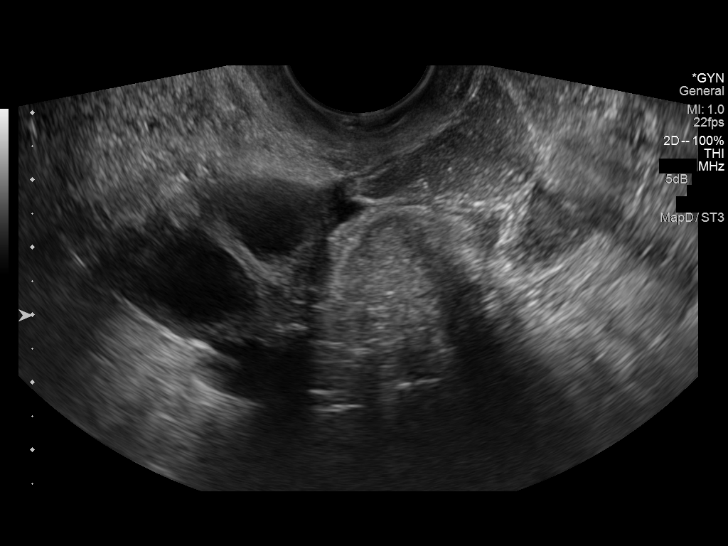
[im 27/59]
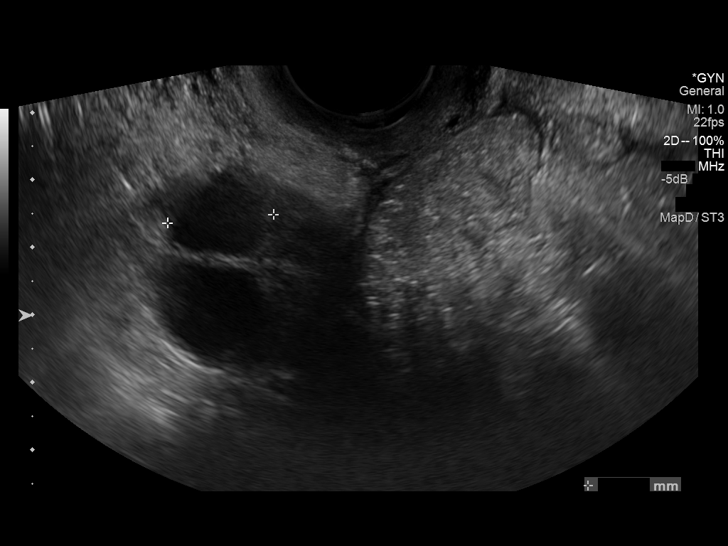
[im 32/59]
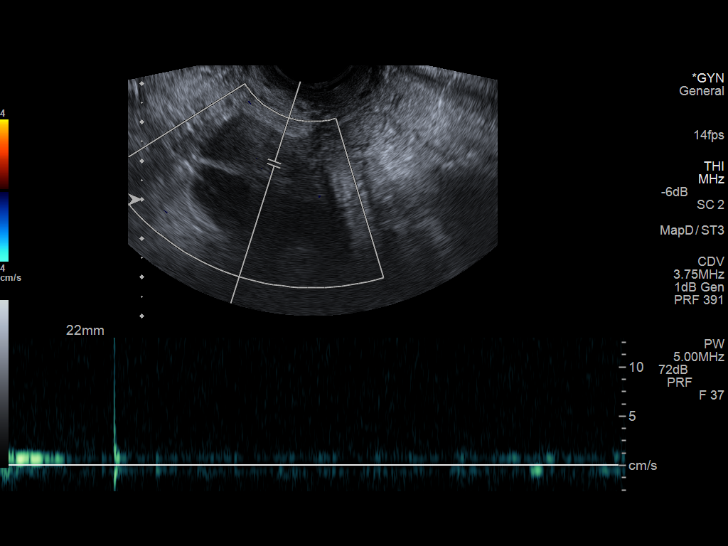
[im 37/59]
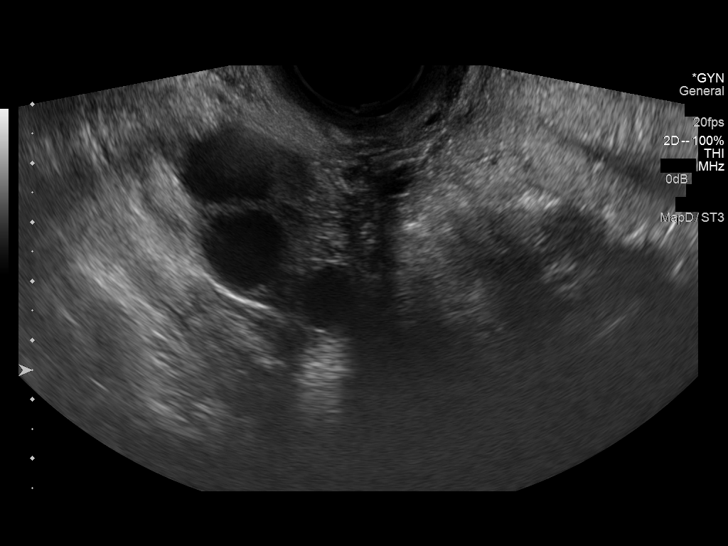
[im 39/59]
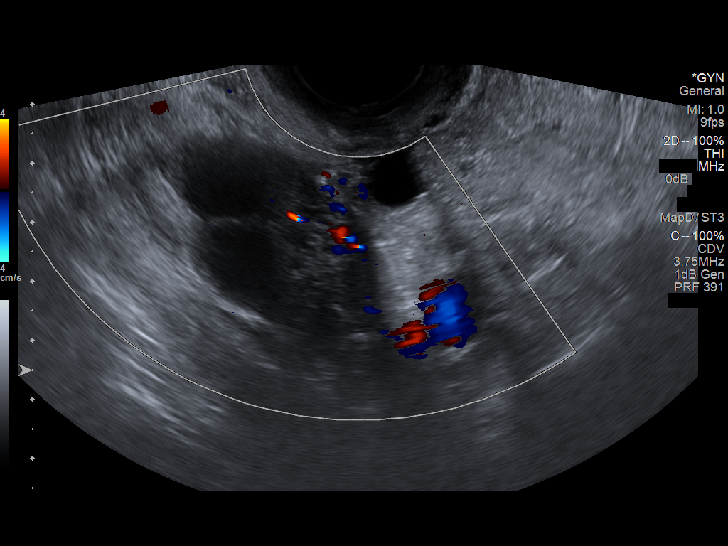
[im 44/59]
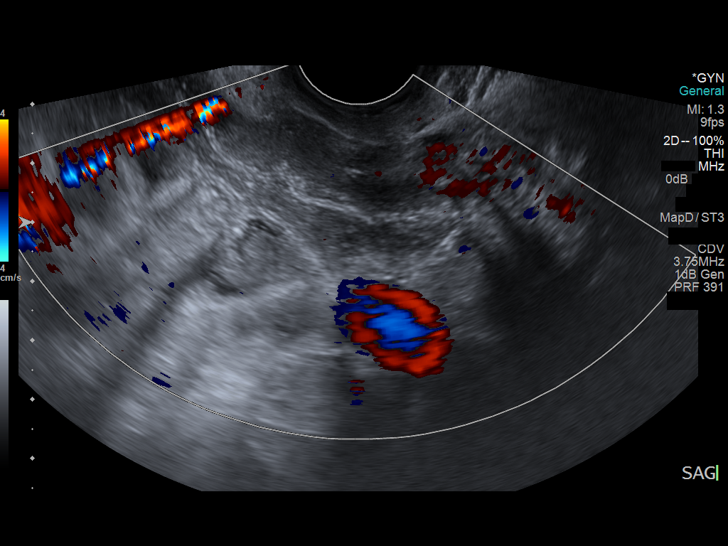
[im 49/59]
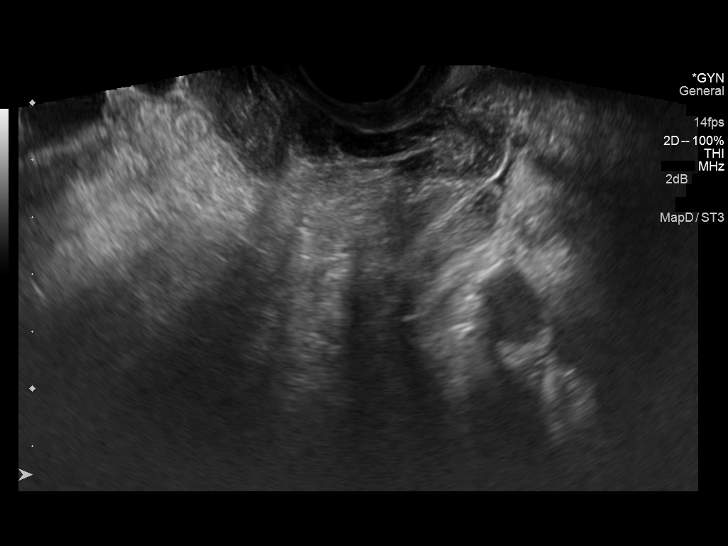
[im 54/59]
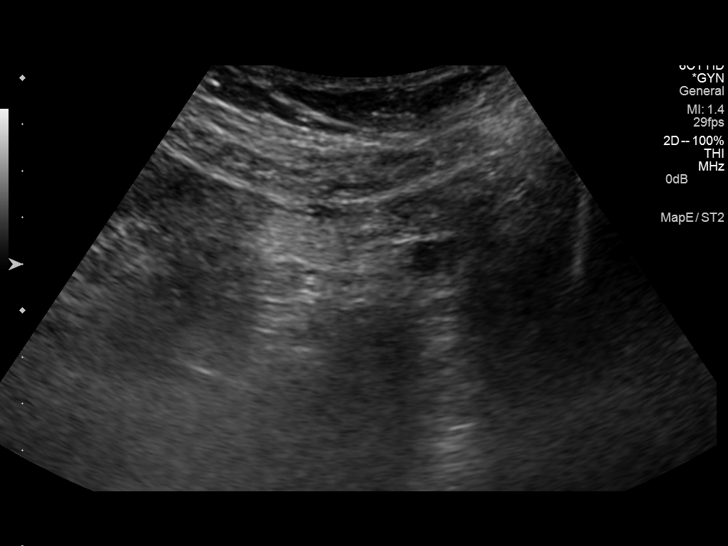
[im 59/59]
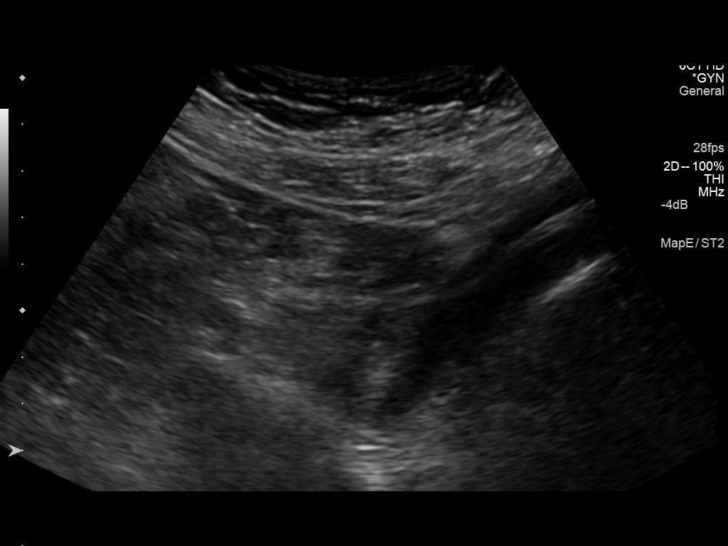

[14 of 25 positions shown; findings below may reference images not displayed]

FINDINGS: The uterus is surgically absent.

Right ovary

Measurements: 48 x 31 x 32 mm. Multiple simple cysts, the largest
measuring 16 x 17 x 18 mm.

Left ovary

Measurements: 30 x 19 x 16 mm.. Left ovary is difficult to visualize
due to overlying bowel gas.

Other findings

Small volume free fluid in the pelvis.
IMPRESSION: Numerous right ovarian cysts.  Small volume free fluid.

## 2015-01-25 ENCOUNTER — Other Ambulatory Visit: Payer: Self-pay

## 2015-01-25 DIAGNOSIS — Z1231 Encounter for screening mammogram for malignant neoplasm of breast: Secondary | ICD-10-CM

## 2015-01-29 ENCOUNTER — Ambulatory Visit
Admission: RE | Admit: 2015-01-29 | Discharge: 2015-01-29 | Disposition: A | Discharge status: Home / Self Care | Payer: BC Managed Care – PPO | Source: Ambulatory Visit

## 2015-01-29 DIAGNOSIS — Z1231 Encounter for screening mammogram for malignant neoplasm of breast: Secondary | ICD-10-CM

## 2015-07-08 ENCOUNTER — Other Ambulatory Visit: Payer: Self-pay | Admitting: Internal Medicine

## 2015-07-08 DIAGNOSIS — N63 Unspecified lump in unspecified breast: Secondary | ICD-10-CM

## 2015-07-14 ENCOUNTER — Ambulatory Visit
Admission: RE | Admit: 2015-07-14 | Discharge: 2015-07-14 | Disposition: A | Discharge status: Home / Self Care | Payer: BC Managed Care – PPO | Source: Ambulatory Visit | Attending: Internal Medicine | Admitting: Internal Medicine

## 2015-07-14 DIAGNOSIS — N63 Unspecified lump in unspecified breast: Secondary | ICD-10-CM

## 2016-02-23 ENCOUNTER — Other Ambulatory Visit: Payer: Self-pay | Admitting: Internal Medicine

## 2016-02-23 DIAGNOSIS — Z1231 Encounter for screening mammogram for malignant neoplasm of breast: Secondary | ICD-10-CM

## 2016-03-03 ENCOUNTER — Ambulatory Visit
Admission: RE | Admit: 2016-03-03 | Discharge: 2016-03-03 | Disposition: A | Discharge status: Home / Self Care | Payer: BC Managed Care – PPO | Source: Ambulatory Visit | Attending: Internal Medicine | Admitting: Internal Medicine

## 2016-03-03 DIAGNOSIS — Z1231 Encounter for screening mammogram for malignant neoplasm of breast: Secondary | ICD-10-CM

## 2017-06-15 ENCOUNTER — Other Ambulatory Visit: Payer: Self-pay | Admitting: Internal Medicine

## 2017-06-15 DIAGNOSIS — Z1231 Encounter for screening mammogram for malignant neoplasm of breast: Secondary | ICD-10-CM

## 2017-07-18 ENCOUNTER — Ambulatory Visit
Admission: RE | Admit: 2017-07-18 | Discharge: 2017-07-18 | Disposition: A | Discharge status: Home / Self Care | Payer: BC Managed Care – PPO | Source: Ambulatory Visit | Attending: Internal Medicine | Admitting: Internal Medicine

## 2017-07-18 DIAGNOSIS — Z1231 Encounter for screening mammogram for malignant neoplasm of breast: Secondary | ICD-10-CM

## 2018-06-24 ENCOUNTER — Other Ambulatory Visit: Payer: Self-pay | Admitting: Family Medicine

## 2019-12-06 ENCOUNTER — Ambulatory Visit: Payer: Self-pay | Attending: Internal Medicine

## 2019-12-06 DIAGNOSIS — Z23 Encounter for immunization: Secondary | ICD-10-CM

## 2019-12-06 NOTE — Progress Notes (Signed)
   Covid-19 Vaccination Clinic  Name:  Ashlee Hardy    MRN: [id number] DOB: 11-30-1970  12/06/2019  Ashlee Hardy was observed post Covid-19 immunization for 15 minutes without incident. She was provided with Vaccine Information Sheet and instruction to access the V-Safe system.   Ashlee Hardy was instructed to call 911 with any severe reactions post vaccine: Marland Kitchen Difficulty breathing  . Swelling of face and throat  . A fast heartbeat  . A bad rash all over body  . Dizziness and weakness   Immunizations Administered    Name Date Dose VIS Date Route   Pfizer COVID-19 Vaccine 12/06/2019 11:45 AM 0.3 mL 09/10/2018 Intramuscular   Manufacturer: [employer], [employer]   Lot: [id number]   [place]: [id number]

## 2019-12-24 ENCOUNTER — Other Ambulatory Visit: Payer: Self-pay | Admitting: Internal Medicine

## 2019-12-24 DIAGNOSIS — Z1231 Encounter for screening mammogram for malignant neoplasm of breast: Secondary | ICD-10-CM

## 2019-12-26 ENCOUNTER — Other Ambulatory Visit: Payer: Self-pay

## 2019-12-26 ENCOUNTER — Ambulatory Visit
Admission: RE | Admit: 2019-12-26 | Discharge: 2019-12-26 | Disposition: A | Discharge status: Home / Self Care | Payer: Managed Care, Other (non HMO) | Source: Ambulatory Visit | Attending: Internal Medicine | Admitting: Internal Medicine

## 2019-12-26 DIAGNOSIS — Z1231 Encounter for screening mammogram for malignant neoplasm of breast: Secondary | ICD-10-CM

## 2019-12-29 ENCOUNTER — Ambulatory Visit: Payer: Managed Care, Other (non HMO) | Attending: Internal Medicine

## 2019-12-29 DIAGNOSIS — Z23 Encounter for immunization: Secondary | ICD-10-CM

## 2019-12-29 NOTE — Progress Notes (Signed)
   Covid-19 Vaccination Clinic  Name:  GURBANI FIGGE    MRN: [id number] DOB: 31-Jan-1971  12/29/2019  Ms. Janczak was observed post Covid-19 immunization for 15 minutes without incident. She was provided with Vaccine Information Sheet and instruction to access the V-Safe system.   Ms. Thatch was instructed to call 911 with any severe reactions post vaccine: Marland Kitchen Difficulty breathing  . Swelling of face and throat  . A fast heartbeat  . A bad rash all over body  . Dizziness and weakness   Immunizations Administered    Name Date Dose VIS Date Route   Pfizer COVID-19 Vaccine 12/29/2019  3:42 PM 0.3 mL 09/10/2018 Intramuscular   Manufacturer: [place]   Lot: [id number]   [place]: [id number]

## 2020-01-12 ENCOUNTER — Other Ambulatory Visit (HOSPITAL_COMMUNITY): Payer: Self-pay | Admitting: Family Medicine

## 2020-01-12 DIAGNOSIS — M7989 Other specified soft tissue disorders: Secondary | ICD-10-CM

## 2020-01-13 ENCOUNTER — Other Ambulatory Visit: Payer: Self-pay

## 2020-01-13 ENCOUNTER — Ambulatory Visit (HOSPITAL_COMMUNITY)
Admission: RE | Admit: 2020-01-13 | Discharge: 2020-01-13 | Disposition: A | Discharge status: Home / Self Care | Payer: Managed Care, Other (non HMO) | Source: Ambulatory Visit | Attending: Family Medicine | Admitting: Family Medicine

## 2020-01-13 DIAGNOSIS — M79604 Pain in right leg: Secondary | ICD-10-CM | POA: Insufficient documentation

## 2020-01-13 DIAGNOSIS — M7989 Other specified soft tissue disorders: Secondary | ICD-10-CM | POA: Diagnosis not present

## 2020-01-13 NOTE — Progress Notes (Signed)
Right lower extremity venous duplex has been completed. Preliminary results can be found in CV Proc through chart review.  Results were given to Debby at Dr. Theda Sers' office.  01/13/20 9:17 AM Ashlee Hardy RVT

## 2020-01-14 ENCOUNTER — Other Ambulatory Visit: Payer: Self-pay | Admitting: Family Medicine

## 2020-01-14 ENCOUNTER — Other Ambulatory Visit (HOSPITAL_COMMUNITY): Payer: Self-pay | Admitting: Family Medicine

## 2020-01-14 ENCOUNTER — Ambulatory Visit (HOSPITAL_COMMUNITY)
Admission: RE | Admit: 2020-01-14 | Discharge: 2020-01-14 | Disposition: A | Discharge status: Home / Self Care | Payer: Managed Care, Other (non HMO) | Source: Ambulatory Visit | Attending: Family Medicine | Admitting: Family Medicine

## 2020-01-14 ENCOUNTER — Encounter (HOSPITAL_COMMUNITY): Payer: Self-pay

## 2020-01-14 DIAGNOSIS — R0602 Shortness of breath: Secondary | ICD-10-CM

## 2020-01-15 ENCOUNTER — Other Ambulatory Visit (HOSPITAL_COMMUNITY): Payer: Managed Care, Other (non HMO)

## 2020-01-15 ENCOUNTER — Other Ambulatory Visit (HOSPITAL_COMMUNITY): Payer: Self-pay | Admitting: Family Medicine

## 2020-01-15 DIAGNOSIS — R000 Tachycardia, unspecified: Secondary | ICD-10-CM

## 2020-01-15 DIAGNOSIS — R0602 Shortness of breath: Secondary | ICD-10-CM

## 2020-01-16 ENCOUNTER — Other Ambulatory Visit: Payer: Self-pay

## 2020-01-16 ENCOUNTER — Ambulatory Visit (HOSPITAL_COMMUNITY)
Admission: RE | Admit: 2020-01-16 | Discharge: 2020-01-16 | Disposition: A | Discharge status: Home / Self Care | Payer: Managed Care, Other (non HMO) | Source: Ambulatory Visit | Attending: Family Medicine | Admitting: Family Medicine

## 2020-01-16 DIAGNOSIS — R000 Tachycardia, unspecified: Secondary | ICD-10-CM | POA: Insufficient documentation

## 2020-01-16 DIAGNOSIS — R0602 Shortness of breath: Secondary | ICD-10-CM | POA: Insufficient documentation

## 2020-01-16 MED ORDER — IOHEXOL 350 MG/ML SOLN
75.0000 mL | Freq: Once | INTRAVENOUS | Status: AC | PRN
  Administered 2020-01-16: 75 mL via INTRAVENOUS

## 2020-07-24 ENCOUNTER — Ambulatory Visit: Payer: Self-pay | Attending: Internal Medicine

## 2020-07-24 DIAGNOSIS — Z23 Encounter for immunization: Secondary | ICD-10-CM

## 2020-07-24 NOTE — Progress Notes (Signed)
   Covid-19 Vaccination Clinic  Name:  Ashlee Hardy    MRN: [id number] DOB: 13-Sep-1970  07/24/2020  Ashlee Hardy was observed post Covid-19 immunization for 15 minutes without incident. She was provided with Vaccine Information Sheet and instruction to access the V-Safe system.   Ashlee Hardy was instructed to call 911 with any severe reactions post vaccine: Marland Kitchen Difficulty breathing  . Swelling of face and throat  . A fast heartbeat  . A bad rash all over body  . Dizziness and weakness   Immunizations Administered    Name Date Dose VIS Date Route   Pfizer COVID-19 Vaccine 07/24/2020 12:41 PM 0.3 mL 05/05/2020 Intramuscular   Manufacturer: [employer]   Lot: [id number]   [place]: [id number]

## 2021-04-11 ENCOUNTER — Other Ambulatory Visit: Payer: Self-pay | Admitting: Family Medicine

## 2021-04-11 DIAGNOSIS — Z1231 Encounter for screening mammogram for malignant neoplasm of breast: Secondary | ICD-10-CM

## 2021-05-04 ENCOUNTER — Ambulatory Visit
Admission: RE | Admit: 2021-05-04 | Discharge: 2021-05-04 | Disposition: A | Discharge status: Home / Self Care | Payer: Managed Care, Other (non HMO) | Source: Ambulatory Visit | Attending: Family Medicine | Admitting: Family Medicine

## 2021-05-04 ENCOUNTER — Other Ambulatory Visit: Payer: Self-pay

## 2021-05-04 DIAGNOSIS — Z1231 Encounter for screening mammogram for malignant neoplasm of breast: Secondary | ICD-10-CM

## 2022-05-25 ENCOUNTER — Other Ambulatory Visit: Payer: Self-pay | Admitting: Family Medicine

## 2022-05-25 DIAGNOSIS — Z1231 Encounter for screening mammogram for malignant neoplasm of breast: Secondary | ICD-10-CM

## 2022-06-21 ENCOUNTER — Ambulatory Visit
Admission: RE | Admit: 2022-06-21 | Discharge: 2022-06-21 | Disposition: A | Discharge status: Home / Self Care | Payer: Managed Care, Other (non HMO) | Source: Ambulatory Visit | Attending: Family Medicine | Admitting: Family Medicine

## 2022-06-21 DIAGNOSIS — Z1231 Encounter for screening mammogram for malignant neoplasm of breast: Secondary | ICD-10-CM

## 2022-08-27 ENCOUNTER — Encounter: Payer: Self-pay | Admitting: Emergency Medicine

## 2022-08-27 ENCOUNTER — Ambulatory Visit
Admission: EM | Admit: 2022-08-27 | Discharge: 2022-08-27 | Disposition: A | Discharge status: Home / Self Care | Payer: Managed Care, Other (non HMO)

## 2022-08-27 DIAGNOSIS — J020 Streptococcal pharyngitis: Secondary | ICD-10-CM

## 2022-08-27 LAB — POCT RAPID STREP A (OFFICE): Rapid Strep A Screen: POSITIVE — AB

## 2022-08-27 MED ORDER — AMOXICILLIN 500 MG PO CAPS: 500.0000 mg | ORAL_CAPSULE | Freq: Two times a day (BID) | ORAL | 0 refills | Status: AC

## 2022-08-27 NOTE — Discharge Instructions (Signed)
You have strep throat which is being treated with an antibiotic.  Follow-up if any symptoms persist or worsen.

## 2022-08-27 NOTE — ED Provider Notes (Signed)
[facility]    CSN: [id number] Arrival date & time: 08/27/22  [id number]      History   Chief Complaint Chief Complaint  Patient presents with   Sore Throat    HPI Ashlee Hardy is a 52 y.o. female.   Patient presents with sore throat and "white patches in throat" that started about 2 days ago.  Patient denies any associated upper respiratory symptoms or cough.  Reports low-grade temp at home.  Has taken Tylenol and Mucinex with minimal improvement of symptoms.  Daughter has similar symptoms.  Denies chest pain or shortness of breath.   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      Home Medications    Prior to Admission medications   Medication Sig Start Date End Date Taking? Authorizing Provider  amoxicillin (AMOXIL) 500 MG capsule Take 1 capsule (500 mg total) by mouth 2 (two) times daily for 10 days. 08/27/22 09/06/22 Yes Monna Crean, Michele Rockers, FNP  dapagliflozin propanediol (FARXIGA) 10 MG TABS tablet  03/18/21  Yes [provider]  furosemide (LASIX) 20 MG tablet Take 20 mg by mouth daily. 06/11/22  Yes [provider]  fluconazole (DIFLUCAN) 150 MG tablet Take 1 tablet (150 mg total) by mouth daily. X 1 dose 05/06/14   Presson, Audelia Hives, PA    Family History Family History  Problem Relation Age of Onset   Diabetes Father     Social History Social History   Tobacco Use   Smoking status: Never   Smokeless tobacco:  Never  Vaping Use   Vaping Use: Never used  Substance Use Topics   Alcohol use: No   Drug use: No     Allergies   Patient has no known allergies.   Review of Systems Review of Systems Per HPI  Physical Exam Triage Vital Signs ED Triage Vitals  Enc Vitals Group     BP 08/27/22 0948 (!) 140/81     Pulse Rate 08/27/22 0948 92     Resp 08/27/22 0948 18     Temp 08/27/22 0948 98.6 F (37 C)     Temp Source 08/27/22 0948 Oral     SpO2 08/27/22 0948 95 %     Weight 08/27/22 0950 217 lb (98.4 kg)     Height 08/27/22 0950 5' 9"$  (1.753 m)     Head Circumference --      Peak Flow --      Pain Score 08/27/22 0950 7     Pain Loc --      Pain Edu? --      Excl. in [place]? --    No data found.  Updated Vital Signs BP (!) 140/81 (BP Location: Left Arm)   Pulse 92   Temp 98.6 F (37 C) (Oral)   Resp 18   Ht 5' 9"$  (1.753 m)   Wt 217 lb (98.4 kg)  SpO2 95%   BMI 32.05 kg/m   Visual Acuity Right Eye Distance:   Left Eye Distance:   Bilateral Distance:    Right Eye Near:   Left Eye Near:    Bilateral Near:     Physical Exam Constitutional:      General: She is not in acute distress.    Appearance: Normal appearance. She is not toxic-appearing or diaphoretic.  HENT:     Head: Normocephalic and atraumatic.     Right Ear: Tympanic membrane and ear canal normal.     Left Ear: Tympanic membrane and ear canal normal.     Nose: No congestion.     Mouth/Throat:     Mouth: Mucous membranes are moist.     Pharynx: Posterior oropharyngeal erythema present. No pharyngeal swelling or oropharyngeal exudate.     Tonsils: Tonsillar exudate present. No tonsillar abscesses. 1+ on the right. 1+ on the left.  Eyes:     Extraocular Movements: Extraocular movements intact.     Conjunctiva/sclera: Conjunctivae normal.     Pupils: Pupils are equal, round, and reactive to light.  Cardiovascular:     Rate and Rhythm: Normal rate and regular rhythm.     Pulses: Normal pulses.     Heart  sounds: Normal heart sounds.  Pulmonary:     Effort: Pulmonary effort is normal. No respiratory distress.     Breath sounds: Normal breath sounds. No stridor. No wheezing, rhonchi or rales.  Abdominal:     General: Abdomen is flat. Bowel sounds are normal.     Palpations: Abdomen is soft.  Musculoskeletal:        General: Normal range of motion.     Cervical back: Normal range of motion.  Skin:    General: Skin is warm and dry.  Neurological:     General: No focal deficit present.     Mental Status: She is alert and oriented to person, place, and time. Mental status is at baseline.  Psychiatric:        Mood and Affect: Mood normal.        Behavior: Behavior normal.      UC Treatments / Results  Labs (all labs ordered are listed, but only abnormal results are displayed) Labs Reviewed  POCT RAPID STREP A (OFFICE) - Abnormal; Notable for the following components:      Result Value   Rapid Strep A Screen Positive (*)    All other components within normal limits    EKG   Radiology No results found.  Procedures Procedures (including critical care time)  Medications Ordered in UC Medications - No data to display  Initial Impression / Assessment and Plan / UC Course  I have reviewed the triage vital signs and the nursing notes.  Pertinent labs & imaging results that were available during my care of the patient were reviewed by me and considered in my medical decision making (see chart for details).     Rapid strep was positive.  Will treat with amoxicillin antibiotic.  There is no sign of peritonsillar abscess on exam.  Discussed supportive care and symptom management with patient.  Discussed return precautions.  Patient verbalized understanding and was agreeable with plan. Final Clinical Impressions(s) / UC Diagnoses   Final diagnoses:  Strep pharyngitis     Discharge Instructions      You have strep throat which is being treated with an antibiotic.  Follow-up  if any symptoms persist or worsen.    ED Prescriptions  Medication Sig Dispense Auth. Provider   amoxicillin (AMOXIL) 500 MG capsule Take 1 capsule (500 mg total) by mouth 2 (two) times daily for 10 days. 20 capsule Teodora Medici, [place]      PDMP not reviewed this encounter.   Teodora Medici, [place] 08/27/22 1045

## 2022-08-27 NOTE — ED Triage Notes (Signed)
Patient c/o sore throat, white patches in throat x 2 days.  Requesting a strep test.  Patient has taken Tylenol and Mucinex.

## 2023-07-05 ENCOUNTER — Other Ambulatory Visit: Payer: Self-pay | Admitting: Family Medicine

## 2023-07-05 DIAGNOSIS — Z1231 Encounter for screening mammogram for malignant neoplasm of breast: Secondary | ICD-10-CM

## 2023-07-20 ENCOUNTER — Ambulatory Visit: Payer: Managed Care, Other (non HMO)

## 2023-08-02 ENCOUNTER — Ambulatory Visit
Admission: RE | Admit: 2023-08-02 | Discharge: 2023-08-02 | Disposition: A | Discharge status: Home / Self Care | Payer: Managed Care, Other (non HMO) | Source: Ambulatory Visit | Attending: Family Medicine | Admitting: Family Medicine

## 2023-08-02 DIAGNOSIS — Z1231 Encounter for screening mammogram for malignant neoplasm of breast: Secondary | ICD-10-CM

## 2023-08-07 ENCOUNTER — Ambulatory Visit: Payer: Self-pay

## 2024-05-06 ENCOUNTER — Other Ambulatory Visit: Payer: Self-pay | Admitting: Pain Medicine

## 2024-05-06 DIAGNOSIS — R928 Other abnormal and inconclusive findings on diagnostic imaging of breast: Secondary | ICD-10-CM

## 2024-05-06 DIAGNOSIS — N6022 Fibroadenosis of left breast: Secondary | ICD-10-CM

## 2024-05-15 ENCOUNTER — Ambulatory Visit
Admission: RE | Admit: 2024-05-15 | Discharge: 2024-05-15 | Disposition: A | Source: Ambulatory Visit | Attending: Pain Medicine | Admitting: Pain Medicine

## 2024-05-15 ENCOUNTER — Ambulatory Visit
Admission: RE | Admit: 2024-05-15 | Discharge: 2024-05-15 | Disposition: A | Discharge status: Home / Self Care | Source: Ambulatory Visit | Attending: Pain Medicine | Admitting: Pain Medicine

## 2024-05-15 DIAGNOSIS — R928 Other abnormal and inconclusive findings on diagnostic imaging of breast: Secondary | ICD-10-CM
